# Patient Record
Sex: Female | Born: 1985 | Race: Black or African American | Hispanic: No | Marital: Married | Smoking: Never smoker
Health system: Southern US, Community
[De-identification: ages and names within clinical notes are randomized; demographics above are authoritative.]

## PROBLEM LIST (undated history)

## (undated) ENCOUNTER — Ambulatory Visit: Payer: Self-pay | Source: Home / Self Care

## (undated) DIAGNOSIS — I10 Essential (primary) hypertension: Secondary | ICD-10-CM

## (undated) DIAGNOSIS — Z8619 Personal history of other infectious and parasitic diseases: Secondary | ICD-10-CM

## (undated) DIAGNOSIS — R569 Unspecified convulsions: Secondary | ICD-10-CM

## (undated) DIAGNOSIS — J302 Other seasonal allergic rhinitis: Secondary | ICD-10-CM

## (undated) HISTORY — PX: OTHER SURGICAL HISTORY: SHX169

## (undated) HISTORY — DX: Personal history of other infectious and parasitic diseases: Z86.19

## (undated) HISTORY — PX: KNEE ARTHROSCOPY WITH ANTERIOR CRUCIATE LIGAMENT (ACL) REPAIR: SHX5644

---

## 2007-07-18 ENCOUNTER — Emergency Department (HOSPITAL_COMMUNITY): Admission: EM | Admit: 2007-07-18 | Discharge: 2007-07-19 | Payer: Self-pay | Admitting: Emergency Medicine

## 2009-06-13 ENCOUNTER — Emergency Department (HOSPITAL_COMMUNITY): Admission: EM | Admit: 2009-06-13 | Discharge: 2009-06-13 | Payer: Self-pay | Admitting: Family Medicine

## 2011-03-03 LAB — URINE CULTURE
Colony Count: NO GROWTH
Culture: NO GROWTH

## 2011-03-03 LAB — DIFFERENTIAL
Basophils Absolute: 0
Basophils Relative: 0
Lymphocytes Relative: 12
Monocytes Absolute: 0.6
Neutro Abs: 8.7 — ABNORMAL HIGH
Neutrophils Relative %: 82 — ABNORMAL HIGH

## 2011-03-03 LAB — I-STAT 8, (EC8 V) (CONVERTED LAB)
Acid-base deficit: 1
BUN: 14
Bicarbonate: 24.4 — ABNORMAL HIGH
HCT: 42
Hemoglobin: 14.3
Operator id: 161631
Sodium: 141
TCO2: 26
pCO2, Ven: 43.3 — ABNORMAL LOW

## 2011-03-03 LAB — URINE MICROSCOPIC-ADD ON

## 2011-03-03 LAB — URINALYSIS, ROUTINE W REFLEX MICROSCOPIC
Ketones, ur: >80 — AB
Nitrite: NEGATIVE
Specific Gravity, Urine: 1.037 — ABNORMAL HIGH
Urobilinogen, UA: 0.2
pH: 5.5

## 2011-03-03 LAB — POCT CARDIAC MARKERS: Myoglobin, poc: 50.8

## 2011-03-03 LAB — CBC
Hemoglobin: 12.9
MCHC: 33.5
RDW: 13.2

## 2011-03-03 LAB — POCT I-STAT CREATININE
Creatinine, Ser: 0.9
Operator id: 161631

## 2011-03-03 LAB — POCT PREGNANCY, URINE
Operator id: 161631
Preg Test, Ur: NEGATIVE

## 2014-04-16 ENCOUNTER — Emergency Department (HOSPITAL_COMMUNITY): Payer: No Typology Code available for payment source

## 2014-04-16 ENCOUNTER — Emergency Department (HOSPITAL_COMMUNITY)
Admission: EM | Admit: 2014-04-16 | Discharge: 2014-04-16 | Disposition: A | Discharge status: Home / Self Care | Payer: No Typology Code available for payment source | Attending: Emergency Medicine | Admitting: Emergency Medicine

## 2014-04-16 ENCOUNTER — Encounter (HOSPITAL_COMMUNITY): Payer: Self-pay | Admitting: Emergency Medicine

## 2014-04-16 DIAGNOSIS — S0990XA Unspecified injury of head, initial encounter: Secondary | ICD-10-CM | POA: Insufficient documentation

## 2014-04-16 DIAGNOSIS — S134XXA Sprain of ligaments of cervical spine, initial encounter: Secondary | ICD-10-CM | POA: Diagnosis not present

## 2014-04-16 DIAGNOSIS — Y92410 Unspecified street and highway as the place of occurrence of the external cause: Secondary | ICD-10-CM | POA: Insufficient documentation

## 2014-04-16 DIAGNOSIS — S4992XA Unspecified injury of left shoulder and upper arm, initial encounter: Secondary | ICD-10-CM | POA: Diagnosis not present

## 2014-04-16 DIAGNOSIS — S139XXA Sprain of joints and ligaments of unspecified parts of neck, initial encounter: Secondary | ICD-10-CM

## 2014-04-16 DIAGNOSIS — Y9389 Activity, other specified: Secondary | ICD-10-CM | POA: Insufficient documentation

## 2014-04-16 DIAGNOSIS — S199XXA Unspecified injury of neck, initial encounter: Secondary | ICD-10-CM | POA: Diagnosis present

## 2014-04-16 MED ORDER — CYCLOBENZAPRINE HCL 10 MG PO TABS: 10.0000 mg | ORAL_TABLET | Freq: Two times a day (BID) | ORAL | Status: DC | PRN

## 2014-04-16 MED ORDER — ACETAMINOPHEN 325 MG PO TABS
650.0000 mg | ORAL_TABLET | Freq: Once | ORAL | Status: AC
  Administered 2014-04-16: 650 mg via ORAL
  Filled 2014-04-16: qty 2

## 2014-04-16 MED ORDER — IBUPROFEN 800 MG PO TABS: 800.0000 mg | ORAL_TABLET | Freq: Three times a day (TID) | ORAL | Status: DC

## 2014-04-16 NOTE — Discharge Instructions (Signed)
Cryotherapy °Cryotherapy means treatment with cold. Ice or gel packs can be used to reduce both pain and swelling. Ice is the most helpful within the first 24 to 48 hours after an injury or flare-up from overusing a muscle or joint. Sprains, strains, spasms, burning pain, shooting pain, and aches can all be eased with ice. Ice can also be used when recovering from surgery. Ice is effective, has very few side effects, and is safe for most people to use. °PRECAUTIONS  °Ice is not a safe treatment option for people with: °· Raynaud phenomenon. This is a condition affecting small blood vessels in the extremities. Exposure to cold may cause your problems to return. °· Cold hypersensitivity. There are many forms of cold hypersensitivity, including: °¨ Cold urticaria. Red, itchy hives appear on the skin when the tissues begin to warm after being iced. °¨ Cold erythema. This is a red, itchy rash caused by exposure to cold. °¨ Cold hemoglobinuria. Red blood cells break down when the tissues begin to warm after being iced. The hemoglobin that carry oxygen are passed into the urine because they cannot combine with blood proteins fast enough. °· Numbness or altered sensitivity in the area being iced. °If you have any of the following conditions, do not use ice until you have discussed cryotherapy with your caregiver: °· Heart conditions, such as arrhythmia, angina, or chronic heart disease. °· High blood pressure. °· Healing wounds or open skin in the area being iced. °· Current infections. °· Rheumatoid arthritis. °· Poor circulation. °· Diabetes. °Ice slows the blood flow in the region it is applied. This is beneficial when trying to stop inflamed tissues from spreading irritating chemicals to surrounding tissues. However, if you expose your skin to cold temperatures for too long or without the proper protection, you can damage your skin or nerves. Watch for signs of skin damage due to cold. °HOME CARE INSTRUCTIONS °Follow  these tips to use ice and cold packs safely. °· Place a dry or damp towel between the ice and skin. A damp towel will cool the skin more quickly, so you may need to shorten the time that the ice is used. °· For a more rapid response, add gentle compression to the ice. °· Ice for no more than 10 to 20 minutes at a time. The bonier the area you are icing, the less time it will take to get the benefits of ice. °· Check your skin after 5 minutes to make sure there are no signs of a poor response to cold or skin damage. °· Rest 20 minutes or more between uses. °· Once your skin is numb, you can end your treatment. You can test numbness by very lightly touching your skin. The touch should be so light that you do not see the skin dimple from the pressure of your fingertip. When using ice, most people will feel these normal sensations in this order: cold, burning, aching, and numbness. °· Do not use ice on someone who cannot communicate their responses to pain, such as small children or people with dementia. °HOW TO MAKE AN ICE PACK °Ice packs are the most common way to use ice therapy. Other methods include ice massage, ice baths, and cryosprays. Muscle creams that cause a cold, tingly feeling do not offer the same benefits that ice offers and should not be used as a substitute unless recommended by your caregiver. °To make an ice pack, do one of the following: °· Place crushed ice or a   bag of frozen vegetables in a sealable plastic bag. Squeeze out the excess air. Place this bag inside another plastic bag. Slide the bag into a pillowcase or place a damp towel between your skin and the bag.  Mix 3 parts water with 1 part rubbing alcohol. Freeze the mixture in a sealable plastic bag. When you remove the mixture from the freezer, it will be slushy. Squeeze out the excess air. Place this bag inside another plastic bag. Slide the bag into a pillowcase or place a damp towel between your skin and the bag. SEEK MEDICAL CARE  IF:  You develop white spots on your skin. This may give the skin a blotchy (mottled) appearance.  Your skin turns blue or pale.  Your skin becomes waxy or hard.  Your swelling gets worse. MAKE SURE YOU:   Understand these instructions.  Will watch your condition.  Will get help right away if you are not doing well or get worse. Document Released: 01/23/2011 Document Revised: 10/13/2013 Document Reviewed: 01/23/2011 [facility] Patient Information 2015 [place], [place]. This information is not intended to replace advice given to you by your health care provider. Make sure you discuss any questions you have with your health care provider.  Cervical Sprain A cervical sprain is an injury in the neck in which the strong, fibrous tissues (ligaments) that connect your neck bones stretch or tear. Cervical sprains can range from mild to severe. Severe cervical sprains can cause the neck vertebrae to be unstable. This can lead to damage of the spinal cord and can result in serious nervous system problems. The amount of time it takes for a cervical sprain to get better depends on the cause and extent of the injury. Most cervical sprains heal in 1 to 3 weeks. CAUSES  Severe cervical sprains may be caused by:   Contact sport injuries (such as from football, rugby, wrestling, hockey, auto racing, gymnastics, diving, martial arts, or boxing).   Motor vehicle collisions.   Whiplash injuries. This is an injury from a sudden forward and backward whipping movement of the head and neck.  Falls.  Mild cervical sprains may be caused by:   Being in an awkward position, such as while cradling a telephone between your ear and shoulder.   Sitting in a chair that does not offer proper support.   Working at a poorly Marketing executivedesigned computer station.   Looking up or down for long periods of time.  SYMPTOMS   Pain, soreness, stiffness, or a burning sensation in the front, back, or sides of the neck. This  discomfort may develop immediately after the injury or slowly, 24 hours or more after the injury.   Pain or tenderness directly in the middle of the back of the neck.   Shoulder or upper back pain.   Limited ability to move the neck.   Headache.   Dizziness.   Weakness, numbness, or tingling in the hands or arms.   Muscle spasms.   Difficulty swallowing or chewing.   Tenderness and swelling of the neck.  DIAGNOSIS  Most of the time your health care provider can diagnose a cervical sprain by taking your history and doing a physical exam. Your health care provider will ask about previous neck injuries and any known neck problems, such as arthritis in the neck. X-rays may be taken to find out if there are any other problems, such as with the bones of the neck. Other tests, such as a CT scan or MRI, may also  be needed.  TREATMENT  Treatment depends on the severity of the cervical sprain. Mild sprains can be treated with rest, keeping the neck in place (immobilization), and pain medicines. Severe cervical sprains are immediately immobilized. Further treatment is done to help with pain, muscle spasms, and other symptoms and may include:  Medicines, such as pain relievers, numbing medicines, or muscle relaxants.   Physical therapy. This may involve stretching exercises, strengthening exercises, and posture training. Exercises and improved posture can help stabilize the neck, strengthen muscles, and help stop symptoms from returning.  HOME CARE INSTRUCTIONS   Put ice on the injured area.   Put ice in a plastic bag.   Place a towel between your skin and the bag.   Leave the ice on for 15-20 minutes, 3-4 times a day.   If your injury was severe, you may have been given a cervical collar to wear. A cervical collar is a two-piece collar designed to keep your neck from moving while it heals.  Do not remove the collar unless instructed by your health care provider.  If you  have long hair, keep it outside of the collar.  Ask your health care provider before making any adjustments to your collar. Minor adjustments may be required over time to improve comfort and reduce pressure on your chin or on the back of your head.  Ifyou are allowed to remove the collar for cleaning or bathing, follow your health care provider's instructions on how to do so safely.  Keep your collar clean by wiping it with mild soap and water and drying it completely. If the collar you have been given includes removable pads, remove them every 1-2 days and hand wash them with soap and water. Allow them to air dry. They should be completely dry before you wear them in the collar.  If you are allowed to remove the collar for cleaning and bathing, wash and dry the skin of your neck. Check your skin for irritation or sores. If you see any, tell your health care provider.  Do not drive while wearing the collar.   Only take over-the-counter or prescription medicines for pain, discomfort, or fever as directed by your health care provider.   Keep all follow-up appointments as directed by your health care provider.   Keep all physical therapy appointments as directed by your health care provider.   Make any needed adjustments to your workstation to promote good posture.   Avoid positions and activities that make your symptoms worse.   Warm up and stretch before being active to help prevent problems.  SEEK MEDICAL CARE IF:   Your pain is not controlled with medicine.   You are unable to decrease your pain medicine over time as planned.   Your activity level is not improving as expected.  SEEK IMMEDIATE MEDICAL CARE IF:   You develop any bleeding.  You develop stomach upset.  You have signs of an allergic reaction to your medicine.   Your symptoms get worse.   You develop new, unexplained symptoms.   You have numbness, tingling, weakness, or paralysis in any part of your  body.  MAKE SURE YOU:   Understand these instructions.  Will watch your condition.  Will get help right away if you are not doing well or get worse. Document Released: 03/26/2007 Document Revised: 06/03/2013 Document Reviewed: 12/04/2012 [facility]Exit[facility] Patient Information 2015 [place]ExitCare, [place]LLC. This information is not intended to replace advice given to you by your health care provider. Make  sure you discuss any questions you have with your health care provider. ° °Motor Vehicle Collision °It is common to have multiple bruises and sore muscles after a motor vehicle collision (MVC). These tend to feel worse for the first 24 hours. You may have the most stiffness and soreness over the first several hours. You may also feel worse when you wake up the first morning after your collision. After this point, you will usually begin to improve with each day. The speed of improvement often depends on the severity of the collision, the number of injuries, and the location and nature of these injuries. °HOME CARE INSTRUCTIONS °· Put ice on the injured area. °¨ Put ice in a plastic bag. °¨ Place a towel between your skin and the bag. °¨ Leave the ice on for 15-20 minutes, 3-4 times a day, or as directed by your health care provider. °· Drink enough fluids to keep your urine clear or pale yellow. Do not drink alcohol. °· Take a warm shower or bath once or twice a day. This will increase blood flow to sore muscles. °· You may return to activities as directed by your caregiver. Be careful when lifting, as this may aggravate neck or back pain. °· Only take over-the-counter or prescription medicines for pain, discomfort, or fever as directed by your caregiver. Do not use aspirin. This may increase bruising and bleeding. °SEEK IMMEDIATE MEDICAL CARE IF: °· You have numbness, tingling, or weakness in the arms or legs. °· You develop severe headaches not relieved with medicine. °· You have severe neck pain, especially tenderness in  the middle of the back of your neck. °· You have changes in bowel or bladder control. °· There is increasing pain in any area of the body. °· You have shortness of breath, light-headedness, dizziness, or fainting. °· You have chest pain. °· You feel sick to your stomach (nauseous), throw up (vomit), or sweat. °· You have increasing abdominal discomfort. °· There is blood in your urine, stool, or vomit. °· You have pain in your shoulder (shoulder strap areas). °· You feel your symptoms are getting worse. °MAKE SURE YOU: °· Understand these instructions. °· Will watch your condition. °· Will get help right away if you are not doing well or get worse. °Document Released: 05/29/2005 Document Revised: 10/13/2013 Document Reviewed: 10/26/2010 °ExitCare® Patient Information ©2015 [employer], LLC. This information is not intended to replace advice given to you by your health care provider. Make sure you discuss any questions you have with your health care provider. ° °
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
## 2014-06-12 NOTE — L&D Delivery Note (Signed)
Delivery Note Pt progressed to complete and pushed well.  At 8:54 PM a viable female was delivered via Vaginal, Spontaneous Delivery (Presentation: Left Occiput Anterior).  APGAR: 8, 9; weight pending.   Placenta status: Intact, Spontaneous.  Cord: 3 vessels with the following complications: None.  Meconium was thought to be light, but was fairly thick at delivery.  Baby bulb suctioned and stimulated and responded well.  Anesthesia: Epidural  Episiotomy: None Lacerations: None Suture Repair: none Est. Blood Loss (mL): 100  Mom to postpartum.  Baby to Couplet care / Skin to Skin.  Lenka Zhao D 01/23/2015, 9:11 PM

## 2014-09-17 LAB — OB RESULTS CONSOLE HEPATITIS B SURFACE ANTIGEN: Hepatitis B Surface Ag: NEGATIVE

## 2014-09-17 LAB — OB RESULTS CONSOLE RPR: RPR: NONREACTIVE

## 2014-09-17 LAB — OB RESULTS CONSOLE ANTIBODY SCREEN: ANTIBODY SCREEN: NEGATIVE

## 2014-09-17 LAB — OB RESULTS CONSOLE GC/CHLAMYDIA
Chlamydia: POSITIVE
GC PROBE AMP, GENITAL: NEGATIVE

## 2014-09-17 LAB — OB RESULTS CONSOLE ABO/RH: RH Type: POSITIVE

## 2014-09-17 LAB — OB RESULTS CONSOLE RUBELLA ANTIBODY, IGM: Rubella: IMMUNE

## 2014-09-17 LAB — OB RESULTS CONSOLE HIV ANTIBODY (ROUTINE TESTING): HIV: NONREACTIVE

## 2014-11-05 LAB — OB RESULTS CONSOLE GC/CHLAMYDIA
Chlamydia: NEGATIVE
Gonorrhea: NEGATIVE

## 2014-12-23 LAB — OB RESULTS CONSOLE GBS: GBS: NEGATIVE

## 2015-01-20 ENCOUNTER — Encounter (HOSPITAL_COMMUNITY): Payer: Self-pay | Admitting: *Deleted

## 2015-01-20 ENCOUNTER — Telehealth (HOSPITAL_COMMUNITY): Payer: Self-pay | Admitting: *Deleted

## 2015-01-20 NOTE — Telephone Encounter (Signed)
Preadmission screen  

## 2015-01-23 ENCOUNTER — Inpatient Hospital Stay (HOSPITAL_COMMUNITY): Payer: Medicaid Other | Admitting: Anesthesiology

## 2015-01-23 ENCOUNTER — Encounter (HOSPITAL_COMMUNITY): Payer: Self-pay | Admitting: *Deleted

## 2015-01-23 ENCOUNTER — Inpatient Hospital Stay (HOSPITAL_COMMUNITY)
Admission: AD | Admit: 2015-01-23 | Discharge: 2015-01-25 | DRG: 775 | Disposition: A | Discharge status: Home / Self Care | Payer: Medicaid Other | Source: Ambulatory Visit | Attending: Obstetrics and Gynecology | Admitting: Obstetrics and Gynecology

## 2015-01-23 DIAGNOSIS — O480 Post-term pregnancy: Secondary | ICD-10-CM | POA: Diagnosis present

## 2015-01-23 DIAGNOSIS — Z3A40 40 weeks gestation of pregnancy: Secondary | ICD-10-CM | POA: Diagnosis present

## 2015-01-23 DIAGNOSIS — Z3480 Encounter for supervision of other normal pregnancy, unspecified trimester: Secondary | ICD-10-CM

## 2015-01-23 DIAGNOSIS — O133 Gestational [pregnancy-induced] hypertension without significant proteinuria, third trimester: Secondary | ICD-10-CM

## 2015-01-23 HISTORY — DX: Essential (primary) hypertension: I10

## 2015-01-23 LAB — COMPREHENSIVE METABOLIC PANEL
ALT: 17 U/L (ref 14–54)
AST: 25 U/L (ref 15–41)
Albumin: 3.1 g/dL — ABNORMAL LOW (ref 3.5–5.0)
Alkaline Phosphatase: 111 U/L (ref 38–126)
Anion gap: 10 (ref 5–15)
BILIRUBIN TOTAL: 1.7 mg/dL — AB (ref 0.3–1.2)
CALCIUM: 8.8 mg/dL — AB (ref 8.9–10.3)
CO2: 20 mmol/L — AB (ref 22–32)
CREATININE: 0.42 mg/dL — AB (ref 0.44–1.00)
Chloride: 110 mmol/L (ref 101–111)
GFR calc Af Amer: >60 mL/min (ref >60)
GLUCOSE: 73 mg/dL (ref 65–99)
Potassium: 3.1 mmol/L — ABNORMAL LOW (ref 3.5–5.1)
Sodium: 140 mmol/L (ref 135–145)
Total Protein: 6.5 g/dL (ref 6.5–8.1)

## 2015-01-23 LAB — CBC
HEMATOCRIT: 36.6 % (ref 36.0–46.0)
HEMOGLOBIN: 12.3 g/dL (ref 12.0–15.0)
MCH: 29.9 pg (ref 26.0–34.0)
MCHC: 33.6 g/dL (ref 30.0–36.0)
MCV: 89.1 fL (ref 78.0–100.0)
Platelets: 164 10*3/uL (ref 150–400)
RBC: 4.11 MIL/uL (ref 3.87–5.11)
RDW: 14.8 % (ref 11.5–15.5)
WBC: 8.5 10*3/uL (ref 4.0–10.5)

## 2015-01-23 LAB — PROTEIN / CREATININE RATIO, URINE
CREATININE, URINE: 89 mg/dL
PROTEIN CREATININE RATIO: 0.24 mg/mg{creat} — AB (ref 0.00–0.15)
TOTAL PROTEIN, URINE: 21 mg/dL

## 2015-01-23 LAB — TYPE AND SCREEN
ABO/RH(D): O POS
Antibody Screen: NEGATIVE

## 2015-01-23 LAB — ABO/RH: ABO/RH(D): O POS

## 2015-01-23 MED ORDER — CITRIC ACID-SODIUM CITRATE 334-500 MG/5ML PO SOLN
30.0000 mL | ORAL | Status: DC | PRN
Start: 2015-01-23 — End: 2015-01-23

## 2015-01-23 MED ORDER — HYDRALAZINE HCL 20 MG/ML IJ SOLN: 10.0000 mg | INTRAMUSCULAR | Status: DC | PRN

## 2015-01-23 MED ORDER — WITCH HAZEL-GLYCERIN EX PADS: 1.0000 "application " | MEDICATED_PAD | CUTANEOUS | Status: DC | PRN

## 2015-01-23 MED ORDER — BENZOCAINE-MENTHOL 20-0.5 % EX AERO: 1.0000 "application " | INHALATION_SPRAY | CUTANEOUS | Status: DC | PRN

## 2015-01-23 MED ORDER — SENNOSIDES-DOCUSATE SODIUM 8.6-50 MG PO TABS
2.0000 | ORAL_TABLET | ORAL | Status: DC
  Administered 2015-01-23 – 2015-01-24 (×2): 2 via ORAL
  Filled 2015-01-23 (×2): qty 2

## 2015-01-23 MED ORDER — TERBUTALINE SULFATE 1 MG/ML IJ SOLN
0.2500 mg | Freq: Once | INTRAMUSCULAR | Status: DC | PRN
  Filled 2015-01-23: qty 1

## 2015-01-23 MED ORDER — LACTATED RINGERS IV SOLN: 500.0000 mL | INTRAVENOUS | Status: DC | PRN

## 2015-01-23 MED ORDER — BUTORPHANOL TARTRATE 1 MG/ML IJ SOLN
1.0000 mg | INTRAMUSCULAR | Status: DC | PRN
Start: 2015-01-23 — End: 2015-01-23
  Administered 2015-01-23: 1 mg via INTRAVENOUS
  Filled 2015-01-23: qty 1

## 2015-01-23 MED ORDER — LANOLIN HYDROUS EX OINT: TOPICAL_OINTMENT | CUTANEOUS | Status: DC | PRN

## 2015-01-23 MED ORDER — LACTATED RINGERS IV SOLN
INTRAVENOUS | Status: DC
  Administered 2015-01-23 (×2): via INTRAVENOUS

## 2015-01-23 MED ORDER — OXYTOCIN BOLUS FROM INFUSION
500.0000 mL | INTRAVENOUS | Status: DC
  Administered 2015-01-23: 500 mL via INTRAVENOUS

## 2015-01-23 MED ORDER — OXYCODONE-ACETAMINOPHEN 5-325 MG PO TABS: 2.0000 | ORAL_TABLET | ORAL | Status: DC | PRN

## 2015-01-23 MED ORDER — MAGNESIUM HYDROXIDE 400 MG/5ML PO SUSP: 30.0000 mL | ORAL | Status: DC | PRN

## 2015-01-23 MED ORDER — METHYLERGONOVINE MALEATE 0.2 MG PO TABS: 0.2000 mg | ORAL_TABLET | ORAL | Status: DC | PRN

## 2015-01-23 MED ORDER — ONDANSETRON HCL 4 MG/2ML IJ SOLN: 4.0000 mg | INTRAMUSCULAR | Status: DC | PRN

## 2015-01-23 MED ORDER — DIPHENHYDRAMINE HCL 25 MG PO CAPS: 25.0000 mg | ORAL_CAPSULE | Freq: Four times a day (QID) | ORAL | Status: DC | PRN

## 2015-01-23 MED ORDER — ONDANSETRON HCL 4 MG PO TABS: 4.0000 mg | ORAL_TABLET | ORAL | Status: DC | PRN

## 2015-01-23 MED ORDER — IBUPROFEN 600 MG PO TABS
600.0000 mg | ORAL_TABLET | Freq: Four times a day (QID) | ORAL | Status: DC
  Administered 2015-01-23 – 2015-01-25 (×7): 600 mg via ORAL
  Filled 2015-01-23 (×7): qty 1

## 2015-01-23 MED ORDER — OXYCODONE-ACETAMINOPHEN 5-325 MG PO TABS: 1.0000 | ORAL_TABLET | ORAL | Status: DC | PRN

## 2015-01-23 MED ORDER — OXYTOCIN 40 UNITS IN LACTATED RINGERS INFUSION - SIMPLE MED
1.0000 m[IU]/min | INTRAVENOUS | Status: DC
  Administered 2015-01-23: 2 m[IU]/min via INTRAVENOUS
  Filled 2015-01-23: qty 1000

## 2015-01-23 MED ORDER — MEASLES, MUMPS & RUBELLA VAC ~~LOC~~ INJ: 0.5000 mL | INJECTION | Freq: Once | SUBCUTANEOUS | Status: DC

## 2015-01-23 MED ORDER — OXYTOCIN 40 UNITS IN LACTATED RINGERS INFUSION - SIMPLE MED
62.5000 mL/h | INTRAVENOUS | Status: DC
  Administered 2015-01-23: 62.5 mL/h via INTRAVENOUS

## 2015-01-23 MED ORDER — LIDOCAINE HCL (PF) 1 % IJ SOLN
30.0000 mL | INTRAMUSCULAR | Status: DC | PRN
  Filled 2015-01-23: qty 30

## 2015-01-23 MED ORDER — SIMETHICONE 80 MG PO CHEW: 80.0000 mg | CHEWABLE_TABLET | ORAL | Status: DC | PRN

## 2015-01-23 MED ORDER — METHYLERGONOVINE MALEATE 0.2 MG/ML IJ SOLN: 0.2000 mg | INTRAMUSCULAR | Status: DC | PRN

## 2015-01-23 MED ORDER — LABETALOL HCL 5 MG/ML IV SOLN
80.0000 mg | INTRAVENOUS | Status: DC | PRN
  Administered 2015-01-23: 80 mg via INTRAVENOUS
  Filled 2015-01-23: qty 16

## 2015-01-23 MED ORDER — HYDRALAZINE HCL 20 MG/ML IJ SOLN
10.0000 mg | Freq: Once | INTRAMUSCULAR | Status: AC | PRN
  Administered 2015-01-23: 10 mg via INTRAVENOUS
  Filled 2015-01-23: qty 1

## 2015-01-23 MED ORDER — EPHEDRINE 5 MG/ML INJ
10.0000 mg | INTRAVENOUS | Status: DC | PRN
  Filled 2015-01-23: qty 2

## 2015-01-23 MED ORDER — DIBUCAINE 1 % RE OINT: 1.0000 "application " | TOPICAL_OINTMENT | RECTAL | Status: DC | PRN

## 2015-01-23 MED ORDER — TETANUS-DIPHTH-ACELL PERTUSSIS 5-2.5-18.5 LF-MCG/0.5 IM SUSP: 0.5000 mL | Freq: Once | INTRAMUSCULAR | Status: DC

## 2015-01-23 MED ORDER — LIDOCAINE HCL (PF) 1 % IJ SOLN
INTRAMUSCULAR | Status: DC | PRN
  Administered 2015-01-23 (×2): 8 mL via EPIDURAL

## 2015-01-23 MED ORDER — PRENATAL MULTIVITAMIN CH
1.0000 | ORAL_TABLET | Freq: Every day | ORAL | Status: DC
  Administered 2015-01-24 – 2015-01-25 (×2): 1 via ORAL
  Filled 2015-01-23 (×2): qty 1

## 2015-01-23 MED ORDER — ZOLPIDEM TARTRATE 5 MG PO TABS: 5.0000 mg | ORAL_TABLET | Freq: Every evening | ORAL | Status: DC | PRN

## 2015-01-23 MED ORDER — FENTANYL 2.5 MCG/ML BUPIVACAINE 1/10 % EPIDURAL INFUSION (WH - ANES)
14.0000 mL/h | INTRAMUSCULAR | Status: DC | PRN
  Administered 2015-01-23: 14 mL/h via EPIDURAL
  Filled 2015-01-23: qty 125

## 2015-01-23 MED ORDER — ACETAMINOPHEN 325 MG PO TABS
650.0000 mg | ORAL_TABLET | ORAL | Status: DC | PRN
  Administered 2015-01-23: 650 mg via ORAL
  Filled 2015-01-23: qty 2

## 2015-01-23 MED ORDER — DIPHENHYDRAMINE HCL 50 MG/ML IJ SOLN: 12.5000 mg | INTRAMUSCULAR | Status: DC | PRN

## 2015-01-23 MED ORDER — ONDANSETRON HCL 4 MG/2ML IJ SOLN: 4.0000 mg | Freq: Four times a day (QID) | INTRAMUSCULAR | Status: DC | PRN

## 2015-01-23 MED ORDER — PHENYLEPHRINE 40 MCG/ML (10ML) SYRINGE FOR IV PUSH (FOR BLOOD PRESSURE SUPPORT)
80.0000 ug | PREFILLED_SYRINGE | INTRAVENOUS | Status: DC | PRN
  Filled 2015-01-23: qty 2
  Filled 2015-01-23: qty 20

## 2015-01-23 MED ORDER — LABETALOL HCL 5 MG/ML IV SOLN
20.0000 mg | INTRAVENOUS | Status: AC | PRN
  Administered 2015-01-23: 20 mg via INTRAVENOUS
  Administered 2015-01-23: 40 mg via INTRAVENOUS
  Administered 2015-01-23: 80 mg via INTRAVENOUS
  Filled 2015-01-23: qty 4
  Filled 2015-01-23: qty 16
  Filled 2015-01-23: qty 8

## 2015-01-23 NOTE — H&P (Addendum)
Sara Bell is a 29 y.o. female, G2 P1001, EGA [redacted] weeks with EDC 8-12 presenting for eval of ctx.  While in MAU had ROM with light meconium, irreg ctx, VE 1 cm.  BP also slightly elevated in MAU, labs normal.  Pt admitted and started on pitocin augmentation.  She progressed and received an epidural.  She required IV Labetalol and hydralazine to control her BP, no other signs/sx of preeclampsia.  Maternal Medical History:  Reason for admission: Rupture of membranes and contractions.   Contractions: Frequency: regular.   Perceived severity is moderate.    Fetal activity: Perceived fetal activity is normal.    Prenatal complications: no prenatal complications   OB History    Gravida Para Term Preterm AB TAB SAB Ectopic Multiple Living   Past Medical History  Diagnosis Date  . Hx of varicella   . Hx of chlamydia infection   . Hypertension    Past Surgical History  Procedure Laterality Date  . Knee arthroscopy with anterior cruciate ligament (acl) repair      r/knee  . Other surgical history Left     Implanon removed   Family History: family history includes Hypertension in her father and mother. Social History:  reports that she has never smoked. She has never used smokeless tobacco. She reports that she does not drink alcohol or use illicit drugs.   Prenatal Transfer Tool  Maternal Diabetes: No Genetic Screening: Declined Maternal Ultrasounds/Referrals: Normal Fetal Ultrasounds or other Referrals:  None Maternal Substance Abuse:  No Significant Maternal Medications:  None Significant Maternal Lab Results:  Lab values include: Group B Strep negative Other Comments:  None  Review of Systems  Respiratory: Negative.   Cardiovascular: Negative.     Dilation: 10 Effacement (%): 100 Station: 0 Exam by:: Conor Lata md Blood pressure 125/85, pulse 90, temperature 99.5 F (37.5 C), temperature source Oral, resp. rate 20, height  (1.676 m), weight  85.078 kg (187 lb 9 oz), last menstrual period 03/30/2014, SpO2 99 %. Maternal Exam:  Uterine Assessment: Contraction strength is mild.  Contraction frequency is irregular.   Abdomen: Patient reports no abdominal tenderness. Estimated fetal weight is 7 1/2 lbs.   Fetal presentation: vertex  Introitus: Normal vulva. Amniotic fluid character: meconium stained.  Pelvis: adequate for delivery.   Cervix: Cervix evaluated by digital exam.     Physical Exam  Vitals reviewed. Constitutional: She appears well-developed and well-nourished.  Cardiovascular: Normal rate, regular rhythm and normal heart sounds.   No murmur heard. Respiratory: Effort normal and breath sounds normal. No respiratory distress. She has no wheezes.  GI: Soft.    Prenatal labs: ABO, Rh: --/--/O POS (08/13 1400) Antibody: NEG (08/13 1400) Rubella: Immune (04/07 0000) RPR: Nonreactive (04/07 0000)  HBsAg: Negative (04/07 0000)  HIV: Non-reactive (04/07 0000)  GBS: Negative (07/13 0000)   Assessment/Plan: IUP at 40 weeks admitted in early labor with ROM with light meconium, labor augmented with pitocin, see delivery note.  Also with PIH, BP requiring IV meds to control, no other signs/sx of preeclampsia, will monitor and treat prn.   Kobyn Kray D 01/23/2015, 9:07 PM

## 2015-01-23 NOTE — Progress Notes (Signed)
Dr Jackelyn Knife notified of pt's arrival, ROM with yellow meconium stained fluid, blood pressure and VE, orders received to admit pt.

## 2015-01-23 NOTE — Anesthesia Procedure Notes (Signed)
Epidural Patient location during procedure: OB Start time: 01/23/2015 6:49 PM End time: 01/23/2015 6:53 PM  Staffing Anesthesiologist: Leilani Able Performed by: anesthesiologist   Preanesthetic Checklist Completed: patient identified, surgical consent, pre-op evaluation, timeout performed, IV checked, risks and benefits discussed and monitors and equipment checked  Epidural Patient position: sitting Prep: site prepped and draped and DuraPrep Patient monitoring: continuous pulse ox and blood pressure Approach: midline Location: L3-L4 Injection technique: LOR air  Needle:  Needle type: Tuohy  Needle gauge: 17 G Needle length: 9 cm and 9 Needle insertion depth: 6 cm Catheter type: closed end flexible Catheter size: 19 Gauge Catheter at skin depth: 11 cm Test dose: negative and Other  Assessment Sensory level: T9 Events: blood not aspirated, injection not painful, no injection resistance, negative IV test and no paresthesia  Additional Notes Reason for block:procedure for pain

## 2015-01-23 NOTE — Progress Notes (Signed)
Dr Jackelyn Knife paged

## 2015-01-23 NOTE — Anesthesia Preprocedure Evaluation (Signed)
Anesthesia Evaluation  Patient identified by MRN, date of birth, ID band Patient awake    Reviewed: Allergy & Precautions, H&P , NPO status , Patient's Chart, lab work & pertinent test results  Airway Mallampati: II  TM Distance: >3 FB Neck ROM: full    Dental no notable dental hx.    Pulmonary neg pulmonary ROS,    Pulmonary exam normal       Cardiovascular hypertension, Pt. on medications Normal cardiovascular exam    Neuro/Psych negative neurological ROS  negative psych ROS   GI/Hepatic negative GI ROS, Neg liver ROS,   Endo/Other  negative endocrine ROS  Renal/GU negative Renal ROS     Musculoskeletal   Abdominal Normal abdominal exam  (+)   Peds  Hematology negative hematology ROS (+)   Anesthesia Other Findings   Reproductive/Obstetrics (+) Pregnancy                             Anesthesia Physical Anesthesia Plan  ASA: II  Anesthesia Plan: Epidural   Post-op Pain Management:    Induction:   Airway Management Planned:   Additional Equipment:   Intra-op Plan:   Post-operative Plan:   Informed Consent: I have reviewed the patients History and Physical, chart, labs and discussed the procedure including the risks, benefits and alternatives for the proposed anesthesia with the patient or authorized representative who has indicated his/her understanding and acceptance.     Plan Discussed with:   Anesthesia Plan Comments:         Anesthesia Quick Evaluation

## 2015-01-23 NOTE — MAU Note (Signed)
Pt states here for contractions q5-7 minutes apart. Has had small amount of leaking noted only after voiding. No bleeding. Cervix closed Wednesday.

## 2015-01-24 LAB — RPR: RPR: NONREACTIVE

## 2015-01-24 NOTE — Lactation Note (Signed)
This note was copied from the chart of Sara Bell. Lactation Consultation Note  Initial visit made.  Breastfeeding consultation and support information given and reviewed.  Mom states she breastfed her first baby for 13 months.  She states newborn has been feeding well and just finished a 40 minute feeding.  Instructed to feed with any feeding cue and to call with concerns/assist prn.  Patient Name: Sara Jenesa Foresta [id number] Date: 01/24/2015 Reason for consult: Initial assessment   Maternal Data Formula Feeding for Exclusion: No Has patient been taught Hand Expression?: Yes Does the patient have breastfeeding experience prior to this delivery?: Yes  Feeding Feeding Type: Breast Fed Length of feed: 30 min  LATCH Score/Interventions Latch: Grasps breast easily, tongue down, lips flanged, rhythmical sucking. Intervention(s): Skin to skin  Audible Swallowing: Spontaneous and intermittent Intervention(s): Skin to skin  Type of Nipple: Everted at rest and after stimulation  Comfort (Breast/Nipple): Soft / non-tender     Hold (Positioning): No assistance needed to correctly position infant at breast. Intervention(s): Skin to skin  LATCH Score: 10  Lactation Tools Discussed/Used     Consult Status Consult Status: Follow-up Date: 01/25/15 Follow-up type: In-patient    Huston Foley 01/24/2015, 9:26 AM

## 2015-01-24 NOTE — Progress Notes (Signed)
Post Partum Day #1 Subjective: no complaints  Objective: Blood pressure 147/87, pulse 72, temperature 98.1 F (36.7 C), temperature source Oral, resp. rate 18, height  (1.676 m), weight 85.078 kg (187 lb 9 oz), last menstrual period 03/30/2014, SpO2 100 %, unknown if currently breastfeeding.  Physical Exam:  General: alert Lochia: appropriate Uterine Fundus: firm   Recent Labs  01/23/15 1400  HGB 12.3  HCT 36.6    Assessment/Plan: Plan for discharge tomorrow.  BP still up a bit but improved, will monitor today.   LOS: 1 day   Faith Branan D 01/24/2015, 10:29 AM

## 2015-01-24 NOTE — Anesthesia Postprocedure Evaluation (Signed)
  Anesthesia Post-op Note  Patient: Sara Bell  Procedure(s) Performed: * No procedures listed *  Patient Location: Mother/Baby  Anesthesia Type:Epidural  Level of Consciousness: awake and alert   Airway and Oxygen Therapy: Patient Spontanous Breathing  Post-op Pain: mild  Post-op Assessment: Post-op Vital signs reviewed, Patient's Cardiovascular Status Stable, Respiratory Function Stable, No signs of Nausea or vomiting, Pain level controlled, No headache, Spinal receding and Patient able to bend at knees              Post-op Vital Signs: Reviewed  Last Vitals:  Filed Vitals:   01/24/15 0700  BP: 147/87  Pulse: 72  Temp: 36.7 C  Resp: 18    Complications: No apparent anesthesia complications

## 2015-01-25 ENCOUNTER — Ambulatory Visit: Payer: Self-pay

## 2015-01-25 MED ORDER — IBUPROFEN 600 MG PO TABS: 600.0000 mg | ORAL_TABLET | Freq: Four times a day (QID) | ORAL | Status: DC

## 2015-01-25 NOTE — Progress Notes (Addendum)
RN in room and pt B/P ELEVATED TO 163/87, HR` 58,  RESP 20, O2 SAT-98, TEMP-98. PT ASYMPTOMATIC. NO H/A . No dizziness, no blurred vision. Reflexes 1 plus in lower extremities. Mild pitting edema 1 plus.

## 2015-01-25 NOTE — Progress Notes (Signed)
DR. Jarold Song NOTIFIED OF B/P CHANGES AND ASYMPTOMATIC. NO NEW ORDERS OBTAINED. DR. Charline Bills "I WILL ADDRESS THE B/P WHEN I COME IN TO THE HOSPITAL"

## 2015-01-25 NOTE — Discharge Summary (Signed)
Obstetric Discharge Summary Reason for Admission: onset of labor Prenatal Procedures: none Intrapartum Procedures: spontaneous vaginal delivery Postpartum Procedures: none Complications-Operative and Postpartum: none HEMOGLOBIN  Date Value Ref Range Status  01/23/2015 12.3 12.0 - 15.0 g/dL Final   HCT  Date Value Ref Range Status  01/23/2015 36.6 36.0 - 46.0 % Final    Physical Exam:  General: alert Lochia: appropriate Uterine Fundus: firm   Discharge Diagnoses: Term Pregnancy-delivered and gestational hypertension  Discharge Information: Date: 01/25/2015 Activity: pelvic rest Diet: routine Medications: Ibuprofen Condition: stable Instructions: refer to practice specific booklet Discharge to: home Follow-up Information    Follow up with Kenzo Ozment D, MD. Schedule an appointment as soon as possible for a visit in 6 weeks.   Specialty:  Obstetrics and Gynecology   Contact information:   [address], [address] [place] [address] [phone]       Newborn Data: Live born female  Birth Weight: 7 lb 0.5 oz (3190 g) APGAR: 8, 9  Home with mother.  Sara Bell 01/25/2015, 8:51 AM

## 2015-01-25 NOTE — Progress Notes (Signed)
PPD #2 Feels fine, no problems Afeb, VSS, BP labile 130-160/60-90 Fundus firm BP occasionally mildly elevated, not consistently enough to treat for now.  Will d/c home, call for any sx, check BP in the office in one week.

## 2015-01-25 NOTE — Lactation Note (Signed)
This note was copied from the chart of [name]. Lactation Consultation Note  Patient Name: [name] [id number] Date: 01/25/2015  [facility] reviewed latching basics with mom and the importance of depth at the breast to enhance flow to baby. And to prevent any further tenderness, LC instructed  Mom on the use shells and hand pump.  And comfort gels. Sore nipple and engorgement prevention and tx reviewed.  Baby latched well with depth  , multiply swallows noted and increased with breast compressions. On the left breast , baby fed for 8 mins and released on his own. Mom had mentioned  Left nipple underneath nipple was getting tender and showed LC a crack. LC had mom latch in the cross cradle and mom was comfortable the whole feeding. Nipple  Appeared normal shape when baby released. Baby asleep .  Mother informed of post-discharge support and given phone number to the lactation department, including services for phone call assistance; out-patient appointments; and breastfeeding support group. List of other breastfeeding resources in the community given in the handout. Encouraged mother to call for problems or concerns related to breastfeeding.   Maternal Data    Feeding    [facility] Score/Interventions                      Lactation Tools Discussed/Used     Consult Status      Sara Bell 01/25/2015, 2:36 PM

## 2015-01-25 NOTE — Discharge Instructions (Signed)
As per discharge pamphlet °

## 2015-01-27 ENCOUNTER — Inpatient Hospital Stay (HOSPITAL_COMMUNITY): Admission: RE | Admit: 2015-01-27 | Payer: No Typology Code available for payment source | Source: Ambulatory Visit

## 2015-01-29 ENCOUNTER — Inpatient Hospital Stay (HOSPITAL_COMMUNITY)
Admission: AD | Admit: 2015-01-29 | Discharge: 2015-01-31 | DRG: 776 | Disposition: A | Discharge status: Home / Self Care | Payer: Medicaid Other | Source: Ambulatory Visit | Attending: Obstetrics and Gynecology | Admitting: Obstetrics and Gynecology

## 2015-01-29 DIAGNOSIS — E876 Hypokalemia: Secondary | ICD-10-CM | POA: Diagnosis present

## 2015-01-29 DIAGNOSIS — O152 Eclampsia in the puerperium: Secondary | ICD-10-CM | POA: Diagnosis present

## 2015-01-29 DIAGNOSIS — R569 Unspecified convulsions: Secondary | ICD-10-CM | POA: Diagnosis present

## 2015-01-29 DIAGNOSIS — O159 Eclampsia, unspecified as to time period: Secondary | ICD-10-CM | POA: Diagnosis present

## 2015-01-29 HISTORY — DX: Unspecified convulsions: R56.9

## 2015-01-29 LAB — COMPREHENSIVE METABOLIC PANEL
ALBUMIN: 3.7 g/dL (ref 3.5–5.0)
ALT: 51 U/L (ref 14–54)
ANION GAP: 11 (ref 5–15)
AST: 39 U/L (ref 15–41)
Alkaline Phosphatase: 104 U/L (ref 38–126)
BILIRUBIN TOTAL: 1.3 mg/dL — AB (ref 0.3–1.2)
BUN: 11 mg/dL (ref 6–20)
CHLORIDE: 109 mmol/L (ref 101–111)
CO2: 21 mmol/L — ABNORMAL LOW (ref 22–32)
Calcium: 8.8 mg/dL — ABNORMAL LOW (ref 8.9–10.3)
Creatinine, Ser: 0.77 mg/dL (ref 0.44–1.00)
GFR calc Af Amer: >60 mL/min (ref >60)
GFR calc non Af Amer: >60 mL/min (ref >60)
GLUCOSE: 80 mg/dL (ref 65–99)
POTASSIUM: 2.9 mmol/L — AB (ref 3.5–5.1)
SODIUM: 141 mmol/L (ref 135–145)
TOTAL PROTEIN: 7.5 g/dL (ref 6.5–8.1)

## 2015-01-29 LAB — CBC
HEMATOCRIT: 41.6 % (ref 36.0–46.0)
Hemoglobin: 14 g/dL (ref 12.0–15.0)
MCH: 30.4 pg (ref 26.0–34.0)
MCHC: 33.7 g/dL (ref 30.0–36.0)
MCV: 90.4 fL (ref 78.0–100.0)
PLATELETS: 216 10*3/uL (ref 150–400)
RBC: 4.6 MIL/uL (ref 3.87–5.11)
RDW: 14.4 % (ref 11.5–15.5)
WBC: 8.5 10*3/uL (ref 4.0–10.5)

## 2015-01-29 LAB — MAGNESIUM: Magnesium: 3.6 mg/dL — ABNORMAL HIGH (ref 1.7–2.4)

## 2015-01-29 LAB — URIC ACID: Uric Acid, Serum: 11.3 mg/dL — ABNORMAL HIGH (ref 2.3–6.6)

## 2015-01-29 LAB — PROTEIN / CREATININE RATIO, URINE
CREATININE, URINE: 26 mg/dL
PROTEIN CREATININE RATIO: 1 mg/mg{creat} — AB (ref 0.00–0.15)
TOTAL PROTEIN, URINE: 26 mg/dL

## 2015-01-29 LAB — LACTATE DEHYDROGENASE: LDH: 243 U/L — ABNORMAL HIGH (ref 98–192)

## 2015-01-29 MED ORDER — LABETALOL HCL 5 MG/ML IV SOLN
20.0000 mg | INTRAVENOUS | Status: DC | PRN
  Administered 2015-01-29 (×2): 20 mg via INTRAVENOUS
  Filled 2015-01-29 (×2): qty 4

## 2015-01-29 MED ORDER — HYDRALAZINE HCL 20 MG/ML IJ SOLN
10.0000 mg | Freq: Once | INTRAMUSCULAR | Status: AC | PRN
  Administered 2015-01-31: 10 mg via INTRAVENOUS
  Filled 2015-01-29: qty 1

## 2015-01-29 MED ORDER — MAGNESIUM SULFATE 50 % IJ SOLN
2.0000 g/h | INTRAVENOUS | Status: DC
  Filled 2015-01-29: qty 80

## 2015-01-29 MED ORDER — MAGNESIUM SULFATE 50 % IJ SOLN
2.0000 g/h | INTRAVENOUS | Status: DC
  Administered 2015-01-29: 3 g/h via INTRAVENOUS
  Administered 2015-01-29: 2 g/h via INTRAVENOUS
  Filled 2015-01-29: qty 80

## 2015-01-29 MED ORDER — DEXTROSE-NACL 5-0.45 % IV SOLN
INTRAVENOUS | Status: DC
  Administered 2015-01-29 (×2): via INTRAVENOUS

## 2015-01-29 MED ORDER — SIMETHICONE 80 MG PO CHEW: 80.0000 mg | CHEWABLE_TABLET | ORAL | Status: DC | PRN

## 2015-01-29 MED ORDER — LANOLIN HYDROUS EX OINT: TOPICAL_OINTMENT | CUTANEOUS | Status: DC | PRN

## 2015-01-29 MED ORDER — LACTATED RINGERS IV SOLN
INTRAVENOUS | Status: DC
  Administered 2015-01-29 (×2): via INTRAVENOUS

## 2015-01-29 MED ORDER — OXYCODONE-ACETAMINOPHEN 5-325 MG PO TABS: 2.0000 | ORAL_TABLET | ORAL | Status: DC | PRN

## 2015-01-29 MED ORDER — IBUPROFEN 600 MG PO TABS
600.0000 mg | ORAL_TABLET | Freq: Four times a day (QID) | ORAL | Status: DC
  Administered 2015-01-29 – 2015-01-31 (×7): 600 mg via ORAL
  Filled 2015-01-29 (×7): qty 1

## 2015-01-29 MED ORDER — LORAZEPAM 2 MG/ML IJ SOLN
1.0000 mg | Freq: Once | INTRAMUSCULAR | Status: AC
  Administered 2015-01-29: 1 mg via INTRAVENOUS

## 2015-01-29 MED ORDER — MAGNESIUM SULFATE BOLUS VIA INFUSION
4.0000 g | Freq: Once | INTRAVENOUS | Status: DC
  Filled 2015-01-29: qty 500

## 2015-01-29 MED ORDER — LABETALOL HCL 200 MG PO TABS
200.0000 mg | ORAL_TABLET | Freq: Two times a day (BID) | ORAL | Status: DC
  Administered 2015-01-29: 200 mg via ORAL
  Filled 2015-01-29 (×2): qty 1

## 2015-01-29 MED ORDER — OXYCODONE-ACETAMINOPHEN 5-325 MG PO TABS: 1.0000 | ORAL_TABLET | ORAL | Status: DC | PRN

## 2015-01-29 MED ORDER — ACETAMINOPHEN 325 MG PO TABS
650.0000 mg | ORAL_TABLET | ORAL | Status: DC | PRN
Start: 2015-01-29 — End: 2015-01-31
  Administered 2015-01-29: 650 mg via ORAL
  Filled 2015-01-29 (×2): qty 2

## 2015-01-29 MED ORDER — PRENATAL MULTIVITAMIN CH
1.0000 | ORAL_TABLET | Freq: Every day | ORAL | Status: DC
  Administered 2015-01-30: 1 via ORAL
  Filled 2015-01-29: qty 1

## 2015-01-29 MED ORDER — MAGNESIUM SULFATE 50 % IJ SOLN
3.0000 g/h | INTRAVENOUS | Status: DC
  Administered 2015-01-29: 3 g/h via INTRAVENOUS
  Filled 2015-01-29: qty 80

## 2015-01-29 MED ORDER — MAGNESIUM SULFATE BOLUS VIA INFUSION
2.0000 g | Freq: Once | INTRAVENOUS | Status: AC
  Administered 2015-01-29: 2 g via INTRAVENOUS
  Filled 2015-01-29: qty 500

## 2015-01-29 NOTE — MAU Provider Note (Signed)
History     CSN: [id number]  Arrival date and time: 01/29/15 0830   None     Chief Complaint  Patient presents with  . Seizures    Two seizures prior to arrival   HPI   Ms.Ann Henrine Screws is a 29 y.o. female G2P2002, status post SVD on 8/13. Patient presents via EMS after two seizures prior to arrival to MAU.   The Patients BP was inconsistently elevated following delivery, however the patient did not have preeclampsia with this pregnancy or her prior pregnancy. She has never been on BP medication. She does not recall the events that led to her seizure this morning. She does not remember the seizure occuring. The patient is now present with her significant other, who at the time of the seizure was at work. The patient denies pain at this time, denies blurred vision. She does not recall injuring her extremities during the fall. She does have a small amount of blood on her pillow in which appears she bit her tongue. The patient denies pain in her mouth.   OB History    Gravida Para Term Preterm AB TAB SAB Ectopic Multiple Living   2 2 2       0 2      Past Medical History  Diagnosis Date  . Hx of varicella   . Hx of chlamydia infection   . Hypertension     Past Surgical History  Procedure Laterality Date  . Knee arthroscopy with anterior cruciate ligament (acl) repair      r/knee  . Other surgical history Left     Implanon removed    Family History  Problem Relation Age of Onset  . Hypertension Mother   . Hypertension Father     Social History  Substance Use Topics  . Smoking status: Never Smoker   . Smokeless tobacco: Never Used  . Alcohol Use: No    Allergies: No Known Allergies  Prescriptions prior to admission  Medication Sig Dispense Refill Last Dose  . cyclobenzaprine (FLEXERIL) 10 MG tablet Take 1 tablet (10 mg total) by mouth 2 (two) times daily as needed for muscle spasms. (Patient not taking: Reported on 01/23/2015) 20 tablet 0   .  diphenhydramine-acetaminophen (TYLENOL PM) 25-500 MG TABS Take 2 tablets by mouth at bedtime as needed.   01/22/2015 at Unknown time  . ibuprofen (ADVIL,MOTRIN) 600 MG tablet Take 1 tablet (600 mg total) by mouth every 6 (six) hours. 30 tablet 0   . Prenatal Vit-Fe Fumarate-FA (PRENATAL MULTIVITAMIN) TABS tablet Take 1 tablet by mouth daily at 12 noon.   01/21/2015 at Unknown time   Results for orders placed or performed during the hospital encounter of 01/29/15 (from the past 48 hour(s))  CBC     Status: None   Collection Time: 01/29/15  8:44 AM  Result Value Ref Range   WBC 8.5 4.0 - 10.5 K/uL   RBC 4.60 3.87 - 5.11 MIL/uL   Hemoglobin 14.0 12.0 - 15.0 g/dL   HCT 41.6 36.0 - 46.0 %   MCV 90.4 78.0 - 100.0 fL   MCH 30.4 26.0 - 34.0 pg   MCHC 33.7 30.0 - 36.0 g/dL   RDW 14.4 11.5 - 15.5 %   Platelets 216 150 - 400 K/uL  Comprehensive metabolic panel     Status: Abnormal   Collection Time: 01/29/15  8:50 AM  Result Value Ref Range   Sodium 141 135 - 145 mmol/L   Potassium 2.9 (L) 3.5 -  5.1 mmol/L   Chloride 109 101 - 111 mmol/L   CO2 21 (L) 22 - 32 mmol/L   Glucose, Bld 80 65 - 99 mg/dL   BUN 11 6 - 20 mg/dL   Creatinine, Ser 0.77 0.44 - 1.00 mg/dL   Calcium 8.8 (L) 8.9 - 10.3 mg/dL   Total Protein 7.5 6.5 - 8.1 g/dL   Albumin 3.7 3.5 - 5.0 g/dL   AST 39 15 - 41 U/L   ALT 51 14 - 54 U/L   Alkaline Phosphatase 104 38 - 126 U/L   Total Bilirubin 1.3 (H) 0.3 - 1.2 mg/dL   GFR calc non Af Amer >60 >60 mL/min   GFR calc Af Amer >60 >60 mL/min    Comment: (NOTE) The eGFR has been calculated using the CKD EPI equation. This calculation has not been validated in all clinical situations. eGFR's persistently <60 mL/min signify possible Chronic Kidney Disease.    Anion gap 11 5 - 15  Uric acid     Status: Abnormal   Collection Time: 01/29/15  8:50 AM  Result Value Ref Range   Uric Acid, Serum 11.3 (H) 2.3 - 6.6 mg/dL  Lactate dehydrogenase     Status: Abnormal   Collection Time:  01/29/15  8:50 AM  Result Value Ref Range   LDH 243 (H) 98 - 192 U/L    Review of Systems  Constitutional: Negative for fever and chills.  Eyes: Negative for blurred vision and double vision.  Gastrointestinal: Negative for abdominal pain.  Neurological: Positive for seizures. Negative for headaches.   Physical Exam   Blood pressure 128/76, pulse 98, resp. rate 20, last menstrual period 03/30/2014, unknown if currently breastfeeding.  Physical Exam  Constitutional: She appears lethargic.  Respiratory: Effort normal. No respiratory distress.  Musculoskeletal: She exhibits edema.  Neurological: She has normal reflexes. She appears lethargic. She is disoriented. She displays no tremor and normal reflexes. She exhibits normal muscle tone. She displays no seizure activity. GCS eye subscore is 4. GCS verbal subscore is 5. GCS motor subscore is 6.  Negative clonus   Skin: Skin is warm.    MAU Course  Procedures  None  MDM 0845: Discussed patient with Dr. Marvel Plan; Mag started 2 gram bolus with 2 gram an hour PIH labs Dr. Marvel Plan to MAU to see patient.   Assessment and Plan   A:   Likely postpartum eclampsia   P:   Admit to ICU for magnesium  Labs pending    Lezlie Lye, NP 01/29/2015'8:53 AM

## 2015-01-29 NOTE — Progress Notes (Addendum)
PPD # 6 pp eclampsia Patient ID: Sara Bell, female   DOB: 1986-01-26, 29 y.o.   MRN: [id number] Pt looking much better. She is awake and alert, HA of earlier improved with one ibuprofen feels thirsty Now almost 8 hours seizure free, will try to advance to clears, then soft diet. Check labs in AM and urine prot/creat ratio  Good diuresis today  Begin labetalol  po BID as has required several IV doses through day Will continue magnesium until 24 hours seizure free. Sister in room with baby and feeding breastmilk and formula mother has pumped.

## 2015-01-29 NOTE — Progress Notes (Signed)
Patient arrived to unit via wheelchair from MAU.  Patient alert and asked for a glass of water.  Patient educated that she was on a nothing by mouth diet order at this time.  Patient self from bed to chair.  Slight unsteady gait.  Patient lying in bed looking toward the window.  Patient asked if she was ok.  Patient did not respond.  RN spoke to patient.  Patient staring and then began to have seizure.  Patient placed on side.  Patient bleeding from side of mouth. MAU RN stated patient had previously bit her tongue during prior seizure.  ELink button pressed and nurse notified.  Dr. Senaida Ores notified.  Gave order for stat Mag level.  Seizure lasted approximately 2-3 minutes.  Patient opening eyes when spoken to.  Dr. Senaida Ores in hospital on way to see patient.  Patient's husband at bedside.

## 2015-01-29 NOTE — Progress Notes (Signed)
Dr. Senaida Ores notified of patient's Magnesium level, CMP and LDH.  No new orders at this time.  Stated to leave at Magnesium 3 g/hr.

## 2015-01-29 NOTE — MAU Note (Addendum)
Patient arrive via ambulance.  Patient is not responding to verbal ques and unable to answer questions at this time. Report recieved from EMS provider. Reports one seizure in the home and one in ambulance.  Given 2.5 g Mediazolam and 2 g Mag Sal in 50 cc NS in ambulance IV. IV started in L Hand by EMS provider prior to arrival.

## 2015-01-29 NOTE — Progress Notes (Addendum)
   01/29/15 1327 01/29/15 1328  Vitals  BP --  (!) 147/85 mmHg  MAP (mmHg) --  99  Pulse Rate 86 --   Oxygen Therapy  SpO2 94 % --    Blood pressure recheck after Labetalol

## 2015-01-29 NOTE — Progress Notes (Signed)
   01/29/15 1623  Vitals  BP (!) 155/92 mmHg  MAP (mmHg) 107  Pulse Rate 75  Resp 18  Oxygen Therapy  SpO2 100 %  O2 Device Room Air   Recheck after Labetalol

## 2015-01-29 NOTE — Lactation Note (Signed)
Lactation Consultation Note  Patient Name: Cece Milhouse [id number] Date: 01/29/2015   Baby 62 days old. Mom readmitted for seizures. Mom cannot remember if she had been pumping or not, but sister reports that mom had breast milk in the refrigerator. Set mom up with DEBP and got her started pumping. Mom able to pump over an ounce of EBM. Mom sleepy and has headache, so discussed pumping schedule with sister as well who states that mom's "boyfriend" will be here this evening and she will relay information to him. Enc mom to pump 8 times/24 hours for 15 minutes. Enc mom to pump as she can tonight, and then pump more often starting in the morning.  Mom is an experience BF mom of her first child a 29-year-old. Mom's right nipple looks cracked. Enc mom to use EBM on nipples for healing. Mom given comfort gels with instructions. Enc mom to maintain a deep latch and ask for assistance with latching as needed.     Maternal Data    Feeding    [facility] Score/Interventions                      Lactation Tools Discussed/Used     Consult Status      Geralynn Ochs 01/29/2015, 4:58 PM

## 2015-01-29 NOTE — H&P (Signed)
Sara Bell is an 29 y.o. female  G2P2  who presents by EMS with seizures and probable postpartum eclampsia.  S/p NSVD on 01/23/15 and prenatal course complicated by mild PIH in labor, but no other signs of preeclampsia.  BP postpartum was reasonable on no medications highest diastolic in the 90 range and normal at other times.  The pt family reports her 53 y/o nephew called 911 and her sister when she had a seizure about 830am.  The patient does not remember anything from this morning.  She denies any pain in extremities from falling, does have a cut in her mouth, but no pain.  She currently denies HA or any other symptoms, still appears sleepy but responding appropriately.  She received a 2gram magnesium bolus by EMS and another 2 gram bolus on arrival to MAU. Her partner states she has been well at home with no c/o prior to this AM except for cramping with breastfeeding.  Pertinent Gynecological History:  OB History: NSVD x 2   Menstrual History:  Patient's last menstrual period was 03/30/2014 (exact date).    Past Medical History  Diagnosis Date  . Hx of varicella   . Hx of chlamydia infection   . Hypertension     Past Surgical History  Procedure Laterality Date  . Knee arthroscopy with anterior cruciate ligament (acl) repair      r/knee  . Other surgical history Left     Implanon removed    Family History  Problem Relation Age of Onset  . Hypertension Mother   . Hypertension Father     Social History:  reports that she has never smoked. She has never used smokeless tobacco. She reports that she does not drink alcohol or use illicit drugs.  Allergies: No Known Allergies  Prescriptions prior to admission  Medication Sig Dispense Refill Last Dose  . cyclobenzaprine (FLEXERIL) 10 MG tablet Take 1 tablet (10 mg total) by mouth 2 (two) times daily as needed for muscle spasms. (Patient not taking: Reported on 01/23/2015) 20 tablet 0   . diphenhydramine-acetaminophen (TYLENOL  PM) 25-500 MG TABS Take 2 tablets by mouth at bedtime as needed.   01/22/2015 at Unknown time  . ibuprofen (ADVIL,MOTRIN) 600 MG tablet Take 1 tablet (600 mg total) by mouth every 6 (six) hours. 30 tablet 0   . Prenatal Vit-Fe Fumarate-FA (PRENATAL MULTIVITAMIN) TABS tablet Take 1 tablet by mouth daily at 12 noon.   01/21/2015 at Unknown time    Review of Systems  Gastrointestinal: Negative for abdominal pain.  Neurological: Positive for seizures. Negative for headaches.    Blood pressure 134/68, pulse 94, resp. rate 21, last menstrual period 03/30/2014, unknown if currently breastfeeding. Physical Exam  Constitutional: She is oriented to person, place, and time. She appears well-developed.  Cardiovascular: Normal rate.   Respiratory: Effort normal.  GI: Soft.  Genitourinary: Vagina normal.  Neurological: She is oriented to person, place, and time. She has normal reflexes.  Psychiatric:  Slightly sedated appearing but answering questions appropriately    Results for orders placed or performed during the hospital encounter of 01/29/15 (from the past 24 hour(s))  CBC     Status: None   Collection Time: 01/29/15  8:44 AM  Result Value Ref Range   WBC 8.5 4.0 - 10.5 K/uL   RBC 4.60 3.87 - 5.11 MIL/uL   Hemoglobin 14.0 12.0 - 15.0 g/dL   HCT 16.1 09.6 - 04.5 %   MCV 90.4 78.0 - 100.0 fL  MCH 30.4 26.0 - 34.0 pg   MCHC 33.7 30.0 - 36.0 g/dL   RDW 16.1 09.6 - 04.5 %   Platelets 216 150 - 400 K/uL    No results found.  Assessment/Plan: Pt admitted with probable pp eclampsia for seizure prophylaxis and close monitoring.  CBC WNL.  Awaiting rest of labs.  Will place on seizure precautions and NPO for now.  BP currently 160/80, will treat with labetalol/hydralazine as needed.    Oliver Pila 01/29/2015, 9:11 AM

## 2015-01-29 NOTE — Progress Notes (Signed)
   01/29/15 1308  Vitals  BP (!) 165/103 mmHg  MAP (mmHg) 117  Pulse Rate 80  Oxygen Therapy  SpO2 95 %   Labetalol 20 mg IV given

## 2015-01-29 NOTE — Progress Notes (Signed)
1640 - Dr. Senaida Ores notified of patient's VS and c/o headache.  Dr. Senaida Ores stated patient may receive her 1800 dose of Motrin now with a small sip of water.

## 2015-01-29 NOTE — Progress Notes (Signed)
   01/29/15 1600  Vitals  BP (!) 162/105 mmHg  MAP (mmHg) 119  Pulse Rate 83  Oxygen Therapy  SpO2 100 %  O2 Device Room Air   Labetalol given

## 2015-01-29 NOTE — Progress Notes (Signed)
Patient ID: Sara Bell, female   DOB: 11/25/1985, 29 y.o.   MRN: [id number] Upon arrival to AICU pt had another 3 min seizure on magnesium Now postictal and moving restlessly Airway good  O2 sat 96% Magnesium at 2g/hour--will increase to 3g/hour and get stat magnesium level, may just not be therapeutic yet BP creeping up 160/100's  Continue seizure precautions and NPO Give ativan 1 mg now Will adjust magnesium  If seizures persist, will need scan of head Begin labetalol protocol

## 2015-01-30 ENCOUNTER — Encounter (HOSPITAL_COMMUNITY): Payer: Self-pay | Admitting: *Deleted

## 2015-01-30 LAB — COMPREHENSIVE METABOLIC PANEL
ALT: 42 U/L (ref 14–54)
AST: 27 U/L (ref 15–41)
Albumin: 3.3 g/dL — ABNORMAL LOW (ref 3.5–5.0)
Alkaline Phosphatase: 98 U/L (ref 38–126)
Anion gap: 9 (ref 5–15)
BILIRUBIN TOTAL: 1.4 mg/dL — AB (ref 0.3–1.2)
BUN: 6 mg/dL (ref 6–20)
CO2: 25 mmol/L (ref 22–32)
Calcium: 6.7 mg/dL — ABNORMAL LOW (ref 8.9–10.3)
Chloride: 104 mmol/L (ref 101–111)
Creatinine, Ser: 0.7 mg/dL (ref 0.44–1.00)
Glucose, Bld: 110 mg/dL — ABNORMAL HIGH (ref 65–99)
Potassium: 2.3 mmol/L — CL (ref 3.5–5.1)
Sodium: 138 mmol/L (ref 135–145)
TOTAL PROTEIN: 6.8 g/dL (ref 6.5–8.1)

## 2015-01-30 LAB — CBC
HEMATOCRIT: 42.1 % (ref 36.0–46.0)
Hemoglobin: 14.5 g/dL (ref 12.0–15.0)
MCH: 30.4 pg (ref 26.0–34.0)
MCHC: 34.4 g/dL (ref 30.0–36.0)
MCV: 88.3 fL (ref 78.0–100.0)
Platelets: 234 10*3/uL (ref 150–400)
RBC: 4.77 MIL/uL (ref 3.87–5.11)
RDW: 14.5 % (ref 11.5–15.5)
WBC: 9.3 10*3/uL (ref 4.0–10.5)

## 2015-01-30 LAB — MAGNESIUM: MAGNESIUM: 7.2 mg/dL — AB (ref 1.7–2.4)

## 2015-01-30 MED ORDER — LABETALOL HCL 300 MG PO TABS
300.0000 mg | ORAL_TABLET | Freq: Two times a day (BID) | ORAL | Status: DC
  Administered 2015-01-30 (×2): 300 mg via ORAL
  Filled 2015-01-30 (×3): qty 1

## 2015-01-30 MED ORDER — POTASSIUM CHLORIDE CRYS ER 20 MEQ PO TBCR
40.0000 meq | EXTENDED_RELEASE_TABLET | Freq: Two times a day (BID) | ORAL | Status: DC
  Administered 2015-01-30 (×2): 40 meq via ORAL
  Filled 2015-01-30 (×3): qty 2

## 2015-01-30 NOTE — Progress Notes (Signed)
Patient ID: Sara Bell, female   DOB: 1985/09/20, 29 y.o.   MRN: [id number] PPD #6 eclampsia  Pt looks much better this AM  Up getting cleaned up.  States legs still feel weak so using bedside commode.  No seizures in >24 hours so MgSO4 d/c Increased labetalol to  po BID and BP improved so far.  K+ low at 2.3 this AM so started K-Dur, will recheck in AM Will continue to follow pt closely, still has no memory of yesterday and also the day before that.

## 2015-01-31 LAB — CBC
HCT: 40.6 % (ref 36.0–46.0)
HEMOGLOBIN: 13.7 g/dL (ref 12.0–15.0)
MCH: 30.3 pg (ref 26.0–34.0)
MCHC: 33.7 g/dL (ref 30.0–36.0)
MCV: 89.8 fL (ref 78.0–100.0)
Platelets: 241 10*3/uL (ref 150–400)
RBC: 4.52 MIL/uL (ref 3.87–5.11)
RDW: 14.7 % (ref 11.5–15.5)
WBC: 8.8 10*3/uL (ref 4.0–10.5)

## 2015-01-31 LAB — POTASSIUM: Potassium: 3.3 mmol/L — ABNORMAL LOW (ref 3.5–5.1)

## 2015-01-31 LAB — COMPREHENSIVE METABOLIC PANEL
ALBUMIN: 3 g/dL — AB (ref 3.5–5.0)
ALT: 30 U/L (ref 14–54)
ANION GAP: 9 (ref 5–15)
AST: 16 U/L (ref 15–41)
Alkaline Phosphatase: 82 U/L (ref 38–126)
BUN: 15 mg/dL (ref 6–20)
CO2: 24 mmol/L (ref 22–32)
Calcium: 8.5 mg/dL — ABNORMAL LOW (ref 8.9–10.3)
Chloride: 107 mmol/L (ref 101–111)
Creatinine, Ser: 0.71 mg/dL (ref 0.44–1.00)
GFR calc Af Amer: >60 mL/min (ref >60)
GFR calc non Af Amer: >60 mL/min (ref >60)
GLUCOSE: 82 mg/dL (ref 65–99)
POTASSIUM: 2.7 mmol/L — AB (ref 3.5–5.1)
SODIUM: 140 mmol/L (ref 135–145)
Total Bilirubin: 1.1 mg/dL (ref 0.3–1.2)
Total Protein: 6.4 g/dL — ABNORMAL LOW (ref 6.5–8.1)

## 2015-01-31 MED ORDER — POTASSIUM CHLORIDE CRYS ER 20 MEQ PO TBCR: 40.0000 meq | EXTENDED_RELEASE_TABLET | Freq: Three times a day (TID) | ORAL | Status: AC

## 2015-01-31 MED ORDER — LABETALOL HCL 300 MG PO TABS: 300.0000 mg | ORAL_TABLET | Freq: Three times a day (TID) | ORAL | Status: AC

## 2015-01-31 MED ORDER — POTASSIUM CHLORIDE 10 MEQ/100ML IV SOLN
10.0000 meq | INTRAVENOUS | Status: AC
  Administered 2015-01-31 (×2): 10 meq via INTRAVENOUS
  Filled 2015-01-31 (×2): qty 100

## 2015-01-31 MED ORDER — POTASSIUM CHLORIDE CRYS ER 20 MEQ PO TBCR
40.0000 meq | EXTENDED_RELEASE_TABLET | Freq: Three times a day (TID) | ORAL | Status: DC
  Administered 2015-01-31: 40 meq via ORAL
  Filled 2015-01-31 (×4): qty 2

## 2015-01-31 MED ORDER — SODIUM CHLORIDE 0.9 % IJ SOLN: 3.0000 mL | INTRAMUSCULAR | Status: DC | PRN

## 2015-01-31 MED ORDER — LABETALOL HCL 300 MG PO TABS
300.0000 mg | ORAL_TABLET | Freq: Three times a day (TID) | ORAL | Status: DC
  Administered 2015-01-31: 300 mg via ORAL
  Filled 2015-01-31 (×4): qty 1

## 2015-01-31 NOTE — Progress Notes (Signed)
   01/31/15 0055  Vitals  BP (!) 164/95 mmHg  MAP (mmHg) 111  BP Location Right Arm  BP Method Automatic  Pulse Rate 66  Pulse Rate Source Monitor  Oxygen Therapy  SpO2 97 %  O2 Device Room Air  Hydralazine 10 mg IV administered as orderd. We will continue to monitor.
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
## 2015-01-31 NOTE — Progress Notes (Signed)
   01/31/15 0130  Vitals  BP (!) 147/82 mmHg  MAP (mmHg) 98  BP Location Right Arm  BP Method Automatic  Patient Position (if appropriate) Lying  Pulse Rate 75  Pulse Rate Source Monitor  Oxygen Therapy  SpO2 98 %  O2 Device Room Air  Medication effective. We will continue to monitor.

## 2015-01-31 NOTE — Progress Notes (Addendum)
Patient ID: Sara Bell, female   DOB: Mar 28, 1986, 29 y.o.   MRN: [id number] Pt looking very good this AM.  Smiling and back to her normal affect.  Wants to go home She has been able to ambulate without feeling weak.  No seizures or HA in 48 hours  BP still elevated on labetalol 150/90's max, some normal, will increase to TID, has still been on BID D/w pt importance of continuing labetalol 3X daily at home.  Her sister is in healthcare and will help her remember  UOP good Potassium improved but still low at 2.7, will increase KDur to TID and give 2 runs of IV potassium before d/c --recheck K+ prior to d/c Other labs WNL  Abdomen soft NT No edema  Pt s/p pp eclampsia, now seizure free for 48 hours, off magnesium 24 hours BP still elevated on labetalol  po BID, will increase to TID and will need BP recheck in 3 days in office Plan d/c home this PM  when potassium runs done if level closer to 3 on recheck

## 2016-07-19 ENCOUNTER — Emergency Department (HOSPITAL_BASED_OUTPATIENT_CLINIC_OR_DEPARTMENT_OTHER)
Admission: EM | Admit: 2016-07-19 | Discharge: 2016-07-19 | Disposition: A | Discharge status: Home / Self Care | Payer: BC Managed Care – PPO | Attending: Emergency Medicine | Admitting: Emergency Medicine

## 2016-07-19 ENCOUNTER — Encounter (HOSPITAL_BASED_OUTPATIENT_CLINIC_OR_DEPARTMENT_OTHER): Payer: Self-pay

## 2016-07-19 ENCOUNTER — Emergency Department (HOSPITAL_BASED_OUTPATIENT_CLINIC_OR_DEPARTMENT_OTHER): Payer: BC Managed Care – PPO

## 2016-07-19 DIAGNOSIS — I10 Essential (primary) hypertension: Secondary | ICD-10-CM | POA: Diagnosis not present

## 2016-07-19 DIAGNOSIS — R0789 Other chest pain: Secondary | ICD-10-CM | POA: Diagnosis present

## 2016-07-19 LAB — CBC
HCT: 37.2 % (ref 36.0–46.0)
Hemoglobin: 12.2 g/dL (ref 12.0–15.0)
MCH: 29.7 pg (ref 26.0–34.0)
MCHC: 32.8 g/dL (ref 30.0–36.0)
MCV: 90.5 fL (ref 78.0–100.0)
PLATELETS: 259 10*3/uL (ref 150–400)
RBC: 4.11 MIL/uL (ref 3.87–5.11)
RDW: 12.4 % (ref 11.5–15.5)
WBC: 7.5 10*3/uL (ref 4.0–10.5)

## 2016-07-19 LAB — BASIC METABOLIC PANEL
Anion gap: 4 — ABNORMAL LOW (ref 5–15)
BUN: 11 mg/dL (ref 6–20)
CO2: 27 mmol/L (ref 22–32)
CREATININE: 0.78 mg/dL (ref 0.44–1.00)
Calcium: 9 mg/dL (ref 8.9–10.3)
Chloride: 108 mmol/L (ref 101–111)
GFR calc Af Amer: >60 mL/min (ref >60)
Glucose, Bld: 98 mg/dL (ref 65–99)
POTASSIUM: 3.4 mmol/L — AB (ref 3.5–5.1)
SODIUM: 139 mmol/L (ref 135–145)

## 2016-07-19 LAB — TROPONIN I

## 2016-07-19 MED ORDER — IBUPROFEN 400 MG PO TABS
400.0000 mg | ORAL_TABLET | Freq: Once | ORAL | Status: AC
  Administered 2016-07-19: 400 mg via ORAL
  Filled 2016-07-19: qty 1

## 2016-07-19 MED ORDER — POTASSIUM CHLORIDE CRYS ER 20 MEQ PO TBCR
40.0000 meq | EXTENDED_RELEASE_TABLET | Freq: Once | ORAL | Status: AC
  Administered 2016-07-19: 40 meq via ORAL
  Filled 2016-07-19: qty 2

## 2016-07-19 NOTE — Discharge Instructions (Signed)
Take Tylenol or Advil as directed for pain. Call the number on these instructions to get a primary care physician. Return if concern for any reason.

## 2016-07-19 NOTE — ED Triage Notes (Signed)
Pt c/o intermittent left side chest pain for a month that has been consistent for the last three days, no radiation to back, no n/v, states she is SOB, no acute distress at the time

## 2016-07-19 NOTE — ED Provider Notes (Addendum)
[facility] Provider Note   CSN: [id number] Arrival date & time: 07/19/16  1953  By signing my name below, I, Talbert NanPaul Grant, attest that this documentation has been prepared under the direction and in the presence of Doug SouSam Yerik Zeringue, MD. Electronically Signed: Talbert NanPaul Grant, Scribe. 07/19/16. 10:29 PM.   History   Chief Complaint Chief Complaint  Patient presents with  . Chest Pain    HPI Sara Bell is a 31 y.o. female with h/o eclampsia and HTN who presents to the Emergency Department complaining of gradual onset, worsening, mild upper left sidedParasternal nonradiating chest pain that has been worsening for the last 2 days, that started 1 month ago. Pt reports that movement of her left arm exacerbates the pain. Pain is improved by remaining still. Pt reports that nothing alleviates the pain. Pt has taken Tylenol today with no relief. No shortness of breath Pt is a nonsmoker and does not drink EtOH. Pt was on patch birth control, but in not any longer. Pt's child is 17 months. Pt is not breastfeeding. Pt denies cough, SOB. Denies hemoptysis denies recent trauma or hospitalization denies leg swelling  The history is provided by the patient. No language interpreter was used.    Past Medical History:  Diagnosis Date  . Hx of chlamydia infection   . Hx of varicella   . Hypertension   . Seizures [facility](HCC)     Patient Active Problem List   Diagnosis Date Noted  . Eclampsia 01/29/2015  . Normal pregnancy, repeat 01/23/2015  . SVD (spontaneous vaginal delivery) 01/23/2015  . Gestational hypertension w/o significant proteinuria in 3rd trimester 01/23/2015    Past Surgical History:  Procedure Laterality Date  . KNEE ARTHROSCOPY WITH ANTERIOR CRUCIATE LIGAMENT (ACL) REPAIR     r/knee  . OTHER SURGICAL HISTORY Left    Implanon removed    OB History    Gravida Para Term Preterm AB Living   2 2 2     2    SAB TAB Ectopic Multiple Live Births         0 2       Home  Medications    Prior to Admission medications   Medication Sig Start Date End Date Taking? Authorizing Provider  labetalol (NORMODYNE) 300 MG tablet Take 1 tablet (300 mg total) by mouth 3 (three) times daily. Patient not taking: Reported on 07/19/2016 01/31/15   Huel CoteKathy Richardson, MD  potassium chloride SA (K-DUR,KLOR-CON) 20 MEQ tablet Take 2 tablets (40 mEq total) by mouth 3 (three) times daily. Patient not taking: Reported on 07/19/2016 01/31/15   Huel CoteKathy Richardson, MD  Prenatal Vit-Fe Fumarate-FA (PRENATAL MULTIVITAMIN) TABS tablet Take 1 tablet by mouth daily at 12 noon.    Historical Provider, MD    Family History Family History  Problem Relation Age of Onset  . Hypertension Mother   . Hypertension Father     Social History Social History  Substance Use Topics  . Smoking status: Never Smoker  . Smokeless tobacco: Never Used  . Alcohol use No     Allergies   Patient has no known allergies.   Review of Systems Review of Systems  Constitutional: Negative.   HENT: Negative.   Respiratory: Negative.  Negative for cough and shortness of breath.   Cardiovascular: Positive for chest pain.  Gastrointestinal: Negative.   Musculoskeletal: Negative.   Skin: Negative.   Neurological: Negative.   Psychiatric/Behavioral: Negative.   All other systems reviewed and are negative.    Physical Exam Updated  Vital Signs BP 131/80 (BP Location: Right Arm)   Pulse 70   Temp 98.2 F (36.8 C)   Resp 18   Ht 5\' 5"  (1.651 m)   Wt 170 lb (77.1 kg)   LMP 07/06/2016   SpO2 99%   BMI 28.29 kg/m   Physical Exam  Constitutional: She appears well-developed and well-nourished.  HENT:  Head: Normocephalic and atraumatic.  Eyes: Conjunctivae are normal. Pupils are equal, round, and reactive to light.  Neck: Neck supple. No tracheal deviation present. No thyromegaly present.  Cardiovascular: Normal rate and regular rhythm.   No murmur heard. Pulmonary/Chest: Effort normal and breath  sounds normal. She exhibits tenderness.  Chest is tender at left parasternal area reproducing pain exactly. Pain is also reproduced reduced easily by forcible abduction of left shoulder  Abdominal: Soft. Bowel sounds are normal. She exhibits no distension. There is no tenderness.  Musculoskeletal: Normal range of motion. She exhibits no edema or tenderness.  Neurological: She is alert. Coordination normal.  Skin: Skin is warm and dry. No rash noted.  Psychiatric: She has a normal mood and affect.  Nursing note and vitals reviewed.    ED Treatments / Results   DIAGNOSTIC STUDIES: Oxygen Saturation is 100% on room air, normal by my interpretation.    COORDINATION OF CARE: 10:28 PM Discussed treatment plan with pt at bedside and pt agreed to plan, which includes rechecking blood pressure.  Labs (all labs ordered are listed, but only abnormal results are displayed) Labs Reviewed  BASIC METABOLIC PANEL - Abnormal; Notable for the following:       Result Value   Potassium 3.4 (*)    Anion gap 4 (*)    All other components within normal limits  CBC  TROPONIN I    EKG  EKG Interpretation  Date/Time:  Wednesday July 19 2016 19:59:33 EST Ventricular Rate:  66 PR Interval:  172 QRS Duration: 88 QT Interval:  370 QTC Calculation: 387 R Axis:   66 Text Interpretation:  Normal sinus rhythm Abnormal ECG Confirmed by Ethelda Chick  MD, Loral Campi [phone]) on 07/19/2016 8:03:22 PM       Radiology Dg Chest 2 View  Result Date: 07/19/2016 CLINICAL DATA:  Chest pain and shortness of breath EXAM: CHEST  2 VIEW COMPARISON:  None. FINDINGS: The lungs are well inflated. Cardiomediastinal contours are normal. No pneumothorax or pleural effusion. No focal airspace consolidation or pulmonary edema. Mild thoracic dextroscoliosis. IMPRESSION: Clear lungs. Electronically Signed   By: Deatra Robinson M.D.   On: 07/19/2016 20:47  Chest x-ray viewed by me Results for orders placed or performed during the  hospital encounter of 07/19/16  Basic metabolic panel  Result Value Ref Range   Sodium 139 135 - 145 mmol/L   Potassium 3.4 (L) 3.5 - 5.1 mmol/L   Chloride 108 101 - 111 mmol/L   CO2 27 22 - 32 mmol/L   Glucose, Bld 98 65 - 99 mg/dL   BUN 11 6 - 20 mg/dL   Creatinine, Ser 6.04 0.44 - 1.00 mg/dL   Calcium 9.0 8.9 - 54.0 mg/dL   GFR calc non Af Amer >60 >60 mL/min   GFR calc Af Amer >60 >60 mL/min   Anion gap 4 (L) 5 - 15  CBC  Result Value Ref Range   WBC 7.5 4.0 - 10.5 K/uL   RBC 4.11 3.87 - 5.11 MIL/uL   Hemoglobin 12.2 12.0 - 15.0 g/dL   HCT 98.1 19.1 - 47.8 %   MCV  90.5 78.0 - 100.0 fL   MCH 29.7 26.0 - 34.0 pg   MCHC 32.8 30.0 - 36.0 g/dL   RDW 16.1 09.6 - 04.5 %   Platelets 259 150 - 400 K/uL  Troponin I  Result Value Ref Range   Troponin I <0.03 <0.03 ng/mL   Dg Chest 2 View  Result Date: 07/19/2016 CLINICAL DATA:  Chest pain and shortness of breath EXAM: CHEST  2 VIEW COMPARISON:  None. FINDINGS: The lungs are well inflated. Cardiomediastinal contours are normal. No pneumothorax or pleural effusion. No focal airspace consolidation or pulmonary edema. Mild thoracic dextroscoliosis. IMPRESSION: Clear lungs. Electronically Signed   By: Deatra Robinson M.D.   On: 07/19/2016 20:47    Procedures Procedures (including critical care time)  Medications Ordered in ED Medications - No data to display   Initial Impression / Assessment and Plan / ED Course  I have reviewed the triage vital signs and the nursing notes.  Pertinent labs & imaging results that were available during my care of the patient were reviewed by me and considered in my medical decision making (see chart for details).     Exam and history is consistent with chest wall pain. Patient is perc negative heart score equals 0. She'll receive oral potassium supplementation prior to discharge. Ibuprofen for pain. She'll be referred to primary care physician  Final Clinical Impressions(s) / ED Diagnoses  Diagnosis  #1chest wall pain #2Hypokalemia Final diagnoses:  None    New Prescriptions New Prescriptions   No medications on file    I personally performed the services described in this documentation, which was scribed in my presence. The recorded information has been reviewed and considered.    Doug Sou, MD 07/19/16 [id number]    Doug Sou, MD 07/19/16 2238

## 2018-05-31 ENCOUNTER — Emergency Department (HOSPITAL_COMMUNITY)
Admission: EM | Admit: 2018-05-31 | Discharge: 2018-05-31 | Disposition: A | Discharge status: Home / Self Care | Payer: BC Managed Care – PPO | Attending: Emergency Medicine | Admitting: Emergency Medicine

## 2018-05-31 ENCOUNTER — Emergency Department (HOSPITAL_COMMUNITY): Payer: BC Managed Care – PPO

## 2018-05-31 ENCOUNTER — Encounter (HOSPITAL_COMMUNITY): Payer: Self-pay | Admitting: Emergency Medicine

## 2018-05-31 ENCOUNTER — Other Ambulatory Visit: Payer: Self-pay

## 2018-05-31 DIAGNOSIS — M25512 Pain in left shoulder: Secondary | ICD-10-CM | POA: Diagnosis not present

## 2018-05-31 DIAGNOSIS — Y929 Unspecified place or not applicable: Secondary | ICD-10-CM | POA: Insufficient documentation

## 2018-05-31 DIAGNOSIS — M25561 Pain in right knee: Secondary | ICD-10-CM | POA: Insufficient documentation

## 2018-05-31 DIAGNOSIS — R4182 Altered mental status, unspecified: Secondary | ICD-10-CM | POA: Diagnosis not present

## 2018-05-31 DIAGNOSIS — M25562 Pain in left knee: Secondary | ICD-10-CM | POA: Insufficient documentation

## 2018-05-31 DIAGNOSIS — S0081XA Abrasion of other part of head, initial encounter: Secondary | ICD-10-CM | POA: Diagnosis not present

## 2018-05-31 DIAGNOSIS — S0033XA Contusion of nose, initial encounter: Secondary | ICD-10-CM | POA: Insufficient documentation

## 2018-05-31 DIAGNOSIS — S60512A Abrasion of left hand, initial encounter: Secondary | ICD-10-CM | POA: Diagnosis not present

## 2018-05-31 DIAGNOSIS — Y999 Unspecified external cause status: Secondary | ICD-10-CM | POA: Insufficient documentation

## 2018-05-31 DIAGNOSIS — Z23 Encounter for immunization: Secondary | ICD-10-CM | POA: Diagnosis not present

## 2018-05-31 DIAGNOSIS — T07XXXA Unspecified multiple injuries, initial encounter: Secondary | ICD-10-CM

## 2018-05-31 DIAGNOSIS — Y939 Activity, unspecified: Secondary | ICD-10-CM | POA: Insufficient documentation

## 2018-05-31 HISTORY — DX: Other seasonal allergic rhinitis: J30.2

## 2018-05-31 MED ORDER — MELOXICAM 15 MG PO TABS: 15.0000 mg | ORAL_TABLET | Freq: Every day | ORAL | 0 refills | Status: AC

## 2018-05-31 MED ORDER — IBUPROFEN 800 MG PO TABS
800.0000 mg | ORAL_TABLET | Freq: Once | ORAL | Status: AC
Start: 2018-05-31 — End: 2018-05-31
  Administered 2018-05-31: 800 mg via ORAL
  Filled 2018-05-31: qty 1

## 2018-05-31 MED ORDER — TETANUS-DIPHTH-ACELL PERTUSSIS 5-2.5-18.5 LF-MCG/0.5 IM SUSP
0.5000 mL | Freq: Once | INTRAMUSCULAR | Status: AC
  Administered 2018-05-31: 0.5 mL via INTRAMUSCULAR
  Filled 2018-05-31: qty 0.5

## 2018-05-31 MED ORDER — CYCLOBENZAPRINE HCL 10 MG PO TABS: 5.0000 mg | ORAL_TABLET | Freq: Two times a day (BID) | ORAL | 0 refills | Status: AC | PRN

## 2018-05-31 NOTE — Discharge Instructions (Addendum)
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
## 2019-03-26 ENCOUNTER — Other Ambulatory Visit: Payer: Self-pay | Admitting: Sports Medicine

## 2019-03-26 DIAGNOSIS — R102 Pelvic and perineal pain: Secondary | ICD-10-CM

## 2019-04-12 ENCOUNTER — Ambulatory Visit
Admission: RE | Admit: 2019-04-12 | Discharge: 2019-04-12 | Disposition: A | Discharge status: Home / Self Care | Payer: BC Managed Care – PPO | Source: Ambulatory Visit | Attending: Sports Medicine | Admitting: Sports Medicine

## 2019-04-12 ENCOUNTER — Other Ambulatory Visit: Payer: Self-pay

## 2019-04-12 DIAGNOSIS — R102 Pelvic and perineal pain: Secondary | ICD-10-CM

## 2020-04-25 ENCOUNTER — Observation Stay (HOSPITAL_COMMUNITY)
Admission: EM | Admit: 2020-04-25 | Discharge: 2020-04-26 | Disposition: A | Discharge status: Home / Self Care | Payer: BC Managed Care – PPO | Attending: General Surgery | Admitting: General Surgery

## 2020-04-25 ENCOUNTER — Other Ambulatory Visit: Payer: Self-pay

## 2020-04-25 ENCOUNTER — Emergency Department (HOSPITAL_COMMUNITY): Payer: BC Managed Care – PPO

## 2020-04-25 ENCOUNTER — Encounter (HOSPITAL_COMMUNITY): Payer: Self-pay | Admitting: Emergency Medicine

## 2020-04-25 DIAGNOSIS — I10 Essential (primary) hypertension: Secondary | ICD-10-CM | POA: Diagnosis not present

## 2020-04-25 DIAGNOSIS — Z79899 Other long term (current) drug therapy: Secondary | ICD-10-CM | POA: Diagnosis not present

## 2020-04-25 DIAGNOSIS — R1013 Epigastric pain: Secondary | ICD-10-CM

## 2020-04-25 DIAGNOSIS — K810 Acute cholecystitis: Principal | ICD-10-CM | POA: Insufficient documentation

## 2020-04-25 DIAGNOSIS — Z20822 Contact with and (suspected) exposure to covid-19: Secondary | ICD-10-CM | POA: Diagnosis not present

## 2020-04-25 LAB — COMPREHENSIVE METABOLIC PANEL
ALT: 22 U/L (ref 0–44)
AST: 16 U/L (ref 15–41)
Albumin: 4.1 g/dL (ref 3.5–5.0)
Alkaline Phosphatase: 73 U/L (ref 38–126)
Anion gap: 11 (ref 5–15)
BUN: 9 mg/dL (ref 6–20)
CO2: 22 mmol/L (ref 22–32)
Calcium: 9.3 mg/dL (ref 8.9–10.3)
Chloride: 104 mmol/L (ref 98–111)
Creatinine, Ser: 0.78 mg/dL (ref 0.44–1.00)
GFR, Estimated: >60 mL/min (ref >60)
Glucose, Bld: 113 mg/dL — ABNORMAL HIGH (ref 70–99)
Potassium: 3.9 mmol/L (ref 3.5–5.1)
Sodium: 137 mmol/L (ref 135–145)
Total Bilirubin: 1.2 mg/dL (ref 0.3–1.2)
Total Protein: 7.7 g/dL (ref 6.5–8.1)

## 2020-04-25 LAB — URINALYSIS, ROUTINE W REFLEX MICROSCOPIC
Bacteria, UA: NONE SEEN
Bilirubin Urine: NEGATIVE
Glucose, UA: NEGATIVE mg/dL
Ketones, ur: 20 mg/dL — AB
Leukocytes,Ua: NEGATIVE
Nitrite: NEGATIVE
Protein, ur: NEGATIVE mg/dL
Specific Gravity, Urine: 1.026 (ref 1.005–1.030)
pH: 6 (ref 5.0–8.0)

## 2020-04-25 LAB — CBC
HCT: 46.1 % — ABNORMAL HIGH (ref 36.0–46.0)
Hemoglobin: 15 g/dL (ref 12.0–15.0)
MCH: 29.7 pg (ref 26.0–34.0)
MCHC: 32.5 g/dL (ref 30.0–36.0)
MCV: 91.3 fL (ref 80.0–100.0)
Platelets: 260 10*3/uL (ref 150–400)
RBC: 5.05 MIL/uL (ref 3.87–5.11)
RDW: 11.9 % (ref 11.5–15.5)
WBC: 8.6 10*3/uL (ref 4.0–10.5)
nRBC: 0 % (ref 0.0–0.2)

## 2020-04-25 LAB — I-STAT BETA HCG BLOOD, ED (MC, WL, AP ONLY): I-stat hCG, quantitative: <5 m[IU]/mL (ref <5)

## 2020-04-25 LAB — LIPASE, BLOOD: Lipase: 22 U/L (ref 11–51)

## 2020-04-25 MED ORDER — PIPERACILLIN-TAZOBACTAM 3.375 G IVPB 30 MIN
3.3750 g | Freq: Once | INTRAVENOUS | Status: AC
  Administered 2020-04-25: 3.375 g via INTRAVENOUS
  Filled 2020-04-25: qty 50

## 2020-04-25 MED ORDER — ONDANSETRON HCL 4 MG/2ML IJ SOLN: 4.0000 mg | Freq: Four times a day (QID) | INTRAMUSCULAR | Status: DC | PRN

## 2020-04-25 MED ORDER — FENTANYL CITRATE (PF) 100 MCG/2ML IJ SOLN
50.0000 ug | Freq: Once | INTRAMUSCULAR | Status: AC
  Administered 2020-04-25: 50 ug via INTRAVENOUS
  Filled 2020-04-25: qty 2

## 2020-04-25 MED ORDER — ACETAMINOPHEN 500 MG PO TABS
1000.0000 mg | ORAL_TABLET | Freq: Three times a day (TID) | ORAL | Status: DC
  Administered 2020-04-25: 1000 mg via ORAL
  Filled 2020-04-25 (×2): qty 2

## 2020-04-25 MED ORDER — FAMOTIDINE IN NACL 20-0.9 MG/50ML-% IV SOLN
20.0000 mg | Freq: Once | INTRAVENOUS | Status: AC
  Administered 2020-04-25: 20 mg via INTRAVENOUS
  Filled 2020-04-25: qty 50

## 2020-04-25 MED ORDER — ONDANSETRON 4 MG PO TBDP: 4.0000 mg | ORAL_TABLET | Freq: Four times a day (QID) | ORAL | Status: DC | PRN

## 2020-04-25 MED ORDER — OXYCODONE HCL 5 MG PO TABS
5.0000 mg | ORAL_TABLET | ORAL | Status: DC | PRN
  Administered 2020-04-26: 5 mg via ORAL

## 2020-04-25 MED ORDER — LACTATED RINGERS IV SOLN: INTRAVENOUS | Status: DC

## 2020-04-25 MED ORDER — MORPHINE SULFATE (PF) 2 MG/ML IV SOLN
1.0000 mg | INTRAVENOUS | Status: DC | PRN
  Administered 2020-04-26: 1 mg via INTRAVENOUS
  Filled 2020-04-25: qty 1

## 2020-04-25 MED ORDER — PIPERACILLIN-TAZOBACTAM 3.375 G IVPB
3.3750 g | Freq: Three times a day (TID) | INTRAVENOUS | Status: DC
  Administered 2020-04-26: 3.375 g via INTRAVENOUS
  Filled 2020-04-25: qty 50

## 2020-04-25 MED ORDER — ENOXAPARIN SODIUM 40 MG/0.4ML ~~LOC~~ SOLN: 40.0000 mg | SUBCUTANEOUS | Status: DC

## 2020-04-25 MED ORDER — ONDANSETRON HCL 4 MG/2ML IJ SOLN
4.0000 mg | Freq: Once | INTRAMUSCULAR | Status: AC
  Administered 2020-04-25: 4 mg via INTRAVENOUS
  Filled 2020-04-25: qty 2

## 2020-04-25 NOTE — H&P (Signed)
CC: Right upper quadrant abdominal pain   Requesting provider: Emergency Department  HPI: Sara Bell is an 34 y.o. female who presents with right upper quadrant abdominal pain. She notes her symptoms have been present for 2 days, but got acutely worse yesterday after a lunch of wings and buffalo dip. She reports pain that was initially present in her right upper quadrant which generalized to her epigastrium with some radiation to her back. She also notes some recent loose stools. She denies fevers or chills. Endorses nausea without vomiting. In the ED, her labs were not remarkable (no leukocytosis or hyperbilirubinemia), but her abdominal U/S was notable for a distended gallbladder with wall thickening, pericholecystic fluid, and a positive sonographic Murphy's sign. General surgery was consulted for evaluation and treatment recommendations.   Past Medical History:  Diagnosis Date  . Hx of chlamydia infection   . Hx of varicella   . Hypertension   . Seasonal allergies   . Seizures (HCC)     Past Surgical History:  Procedure Laterality Date  . KNEE ARTHROSCOPY WITH ANTERIOR CRUCIATE LIGAMENT (ACL) REPAIR     r/knee  . OTHER SURGICAL HISTORY Left    Implanon removed    Family History  Problem Relation Age of Onset  . Hypertension Mother   . Hypertension Father     Social:  reports that she has never smoked. She has never used smokeless tobacco. She reports that she does not drink alcohol and does not use drugs.  Allergies: No Known Allergies  Medications: I have reviewed the patient's current medications.   ROS - all of the below systems have been reviewed with the patient and positives are indicated with bold text General: chills, fever or night sweats Eyes: blurry vision or double vision ENT: epistaxis or sore throat Allergy/Immunology: itchy/watery eyes or nasal congestion Hematologic/Lymphatic: bleeding problems, blood clots or swollen lymph nodes Endocrine:  temperature intolerance or unexpected weight changes Breast: new or changing breast lumps or nipple discharge Resp: cough, shortness of breath, or wheezing CV: chest pain or dyspnea on exertion GI: as per HPI GU: dysuria, trouble voiding, or hematuria MSK: joint pain or joint stiffness Neuro: TIA or stroke symptoms Derm: pruritus and skin lesion changes Psych: anxiety and depression  PE Blood pressure (!) 154/80, pulse 70, temperature 98.2 F (36.8 C), temperature source Oral, resp. rate 16, height 5\' 6"  (1.676 m), weight 86.2 kg, last menstrual period 04/18/2020, SpO2 100 %, unknown if currently breastfeeding. Constitutional: NAD; conversant; no deformities Eyes: Moist conjunctiva; no lid lag; anicteric; PERRL Neck: Trachea midline; no thyromegaly Lungs: Normal respiratory effort; no tactile fremitus CV: RRR; no palpable thrills; no pitting edema GI: Abd soft, tenderness to deep palpation in epigastrium and right upper quadrant, positive clinical Murphy's sign, no rebound or guarding, non-distended; no palpable hepatosplenomegaly MSK: moves all extremities; no clubbing/cyanosis Psychiatric: Appropriate affect; alert and oriented x3 Lymphatic: No palpable cervical or axillary lymphadenopathy Skin: No rashes or lesions appreciated  Results for orders placed or performed during the hospital encounter of 04/25/20 (from the past 48 hour(s))  Lipase, blood     Status: None   Collection Time: 04/25/20  2:45 PM  Result Value Ref Range   Lipase 22 11 - 51 U/L    Comment: Performed at [facility], [address] [place], [place]  Comprehensive metabolic panel     Status: Abnormal   Collection Time: 04/25/20  2:45 PM  Result Value Ref Range   Sodium 137 135 -  145 mmol/L   Potassium 3.9 3.5 - 5.1 mmol/L   Chloride 104 98 - 111 mmol/L   CO2 22 22 - 32 mmol/L   Glucose, Bld 113 (H) 70 - 99 mg/dL    Comment: Glucose reference range applies only to samples taken after  fasting for at least 8 hours.   BUN 9 6 - 20 mg/dL   Creatinine, Ser 4.26 0.44 - 1.00 mg/dL   Calcium 9.3 8.9 - 83.4 mg/dL   Total Protein 7.7 6.5 - 8.1 g/dL   Albumin 4.1 3.5 - 5.0 g/dL   AST 16 15 - 41 U/L   ALT 22 0 - 44 U/L   Alkaline Phosphatase 73 38 - 126 U/L   Total Bilirubin 1.2 0.3 - 1.2 mg/dL   GFR, Estimated >19 >62 mL/min    Comment: (NOTE) Calculated using the CKD-EPI Creatinine Equation (2021)    Anion gap 11 5 - 15    Comment: Performed at [facility], [address] [place], [place] [address]  CBC     Status: Abnormal   Collection Time: 04/25/20  2:45 PM  Result Value Ref Range   WBC 8.6 4.0 - 10.5 K/uL   RBC 5.05 3.87 - 5.11 MIL/uL   Hemoglobin 15.0 12.0 - 15.0 g/dL   HCT 89.2 (H) 36 - 46 %   MCV 91.3 80.0 - 100.0 fL   MCH 29.7 26.0 - 34.0 pg   MCHC 32.5 30.0 - 36.0 g/dL   RDW 11.9 41.7 - 40.8 %   Platelets 260 150 - 400 K/uL   nRBC 0.0 0.0 - 0.2 %    Comment: Performed at [facility], [address] [place], [place] [address]  I-Stat beta hCG blood, ED     Status: None   Collection Time: 04/25/20  2:47 PM  Result Value Ref Range   I-stat hCG, quantitative <5.0 <5 mIU/mL   Comment 3            Comment:   GEST. AGE      CONC.  (mIU/mL)   <=1 WEEK        5 - 50     2 WEEKS       50 - 500     3 WEEKS       100 - 10,000     4 WEEKS     1,000 - 30,000        FEMALE AND NON-PREGNANT FEMALE:     LESS THAN 5 mIU/mL   Urinalysis, Routine w reflex microscopic Urine, Clean Catch     Status: Abnormal   Collection Time: 04/25/20  8:18 PM  Result Value Ref Range   Color, Urine YELLOW YELLOW   APPearance CLEAR CLEAR   Specific Gravity, Urine 1.026 1.005 - 1.030   pH 6.0 5.0 - 8.0   Glucose, UA NEGATIVE NEGATIVE mg/dL   Hgb urine dipstick SMALL (A) NEGATIVE   Bilirubin Urine NEGATIVE NEGATIVE   Ketones, ur 20 (A) NEGATIVE mg/dL   Protein, ur NEGATIVE NEGATIVE mg/dL   Nitrite NEGATIVE NEGATIVE   Leukocytes,Ua NEGATIVE NEGATIVE   RBC / HPF 0-5 0  - 5 RBC/hpf   WBC, UA 0-5 0 - 5 WBC/hpf   Bacteria, UA NONE SEEN NONE SEEN   Squamous Epithelial / LPF 0-5 0 - 5   Mucus PRESENT     Comment: Performed at [facility], [address], [place], [place] [address]    US Abdomen  Limited RUQ (LIVER/GB)  Result Date: 04/25/2020 CLINICAL DATA:  Nausea and vomiting EXAM: ULTRASOUND ABDOMEN LIMITED RIGHT UPPER QUADRANT COMPARISON:  None. FINDINGS: Gallbladder: There are gallstones. There is gallbladder wall thickening with the gallbladder wall measuring up to approximately 5 mm. The gallbladder is distended. There is pericholecystic free fluid. The sonographic Eulah Pont sign is positive. Common bile duct: Diameter: 6 mm Liver: No focal lesion identified. Within normal limits in parenchymal echogenicity. Portal vein is patent on color Doppler imaging with normal direction of blood flow towards the liver. Other: None. IMPRESSION: Findings are consistent with acute calculus cholecystitis in the appropriate clinical setting. Electronically Signed   By: Katherine Mantle M.D.   On: 04/25/2020 19:46    Imaging: As above   A/P: Sara Bell is an 34 y.o. female with acute cholecystitis.   - Admit to general surgery service - NPO, MIVF - Zosyn - Laparoscopic cholecystectomy tomorrow   Lannette Donath, MD General Surgery

## 2020-04-25 NOTE — ED Provider Notes (Signed)
[facility] EMERGENCY DEPARTMENT Provider Note   CSN: [id number] Arrival date & time: 04/25/20  1405     History Chief Complaint  Patient presents with  . Abdominal Pain    Sara Bell is a 34 y.o. female.  She said for about 2 weeks she is having loose bowel movements after every meal.  Starting yesterday after eating a spicy meal she has been having sharp upper central abdominal pain that wraps around to her back.  Has been nauseous no vomiting.  No fevers or chills.  She is concerned because her sister had her gallbladder out about her age.  No blood from above or below.  No urinary symptoms vaginal bleeding or discharge.  Does not drink alcohol or smoke, does not use NSAIDs regularly.   The history is provided by the patient.  Abdominal Pain Pain location:  Epigastric Pain quality: stabbing   Pain radiates to:  Back Pain severity:  Moderate Onset quality:  Gradual Duration:  2 days Timing:  Constant Progression:  Unchanged Chronicity:  New Context: not trauma   Relieved by:  None tried Worsened by:  Nothing Ineffective treatments:  None tried Associated symptoms: diarrhea and nausea   Associated symptoms: no chest pain, no cough, no dysuria, no fever, no hematemesis, no hematochezia, no hematuria, no shortness of breath, no sore throat, no vaginal bleeding, no vaginal discharge and no vomiting   Risk factors: no alcohol abuse, no NSAID use and not obese        Past Medical History:  Diagnosis Date  . Hx of chlamydia infection   . Hx of varicella   . Hypertension   . Seasonal allergies   . Seizures [facility])     Patient Active Problem List   Diagnosis Date Noted  . Eclampsia 01/29/2015  . Normal pregnancy, repeat 01/23/2015  . SVD (spontaneous vaginal delivery) 01/23/2015  . Gestational hypertension w/o significant proteinuria in 3rd trimester 01/23/2015    Past Surgical History:  Procedure Laterality Date  . KNEE ARTHROSCOPY WITH  ANTERIOR CRUCIATE LIGAMENT (ACL) REPAIR     r/knee  . OTHER SURGICAL HISTORY Left    Implanon removed     OB History    Gravida  2   Para  2   Term  2   Preterm      AB      Living  2     SAB      TAB      Ectopic      Multiple  0   Live Births  2           Family History  Problem Relation Age of Onset  . Hypertension Mother   . Hypertension Father     Social History   Tobacco Use  . Smoking status: Never Smoker  . Smokeless tobacco: Never Used  Substance Use Topics  . Alcohol use: No  . Drug use: No    Home Medications Prior to Admission medications   Medication Sig Start Date End Date Taking? Authorizing Provider  cyclobenzaprine (FLEXERIL) 10 MG tablet Take 0.5-1 tablets (5-10 mg total) by mouth 2 (two) times daily as needed for muscle spasms. 05/31/18   Arthor Captain, PA-C  labetalol (NORMODYNE) 300 MG tablet Take 1 tablet (300 mg total) by mouth 3 (three) times daily. Patient not taking: Reported on 07/19/2016 01/31/15   Huel Cote, MD  meloxicam (MOBIC) 15 MG tablet Take 1 tablet (15 mg total) by mouth daily. Take 1  daily with food. 05/31/18   Harris, Cammy Copa, PA-C  potassium chloride SA (K-DUR,KLOR-CON) 20 MEQ tablet Take 2 tablets (40 mEq total) by mouth 3 (three) times daily. Patient not taking: Reported on 07/19/2016 01/31/15   Huel Cote, MD  Prenatal Vit-Fe Fumarate-FA (PRENATAL MULTIVITAMIN) TABS tablet Take 1 tablet by mouth daily at 12 noon.    [provider]    Allergies    Patient has no known allergies.  Review of Systems   Review of Systems  Constitutional: Negative for fever.  HENT: Negative for sore throat.   Eyes: Negative for visual disturbance.  Respiratory: Negative for cough and shortness of breath.   Cardiovascular: Negative for chest pain.  Gastrointestinal: Positive for abdominal pain, diarrhea and nausea. Negative for hematemesis, hematochezia and vomiting.  Genitourinary: Negative for dysuria,  hematuria, vaginal bleeding and vaginal discharge.  Musculoskeletal: Positive for back pain.  Skin: Negative for rash.  Neurological: Negative for headaches.    Physical Exam Updated Vital Signs BP (!) 155/90 (BP Location: Left Arm)   Pulse (!) 58   Temp 98.2 F (36.8 C) (Oral)   Resp 14   Ht 5\' 6"  (1.676 m)   Wt 86.2 kg   LMP 04/18/2020   SpO2 98%   BMI 30.67 kg/m   Physical Exam Vitals and nursing note reviewed.  Constitutional:      General: She is not in acute distress.    Appearance: Normal appearance. She is well-developed.  HENT:     Head: Normocephalic and atraumatic.  Eyes:     Conjunctiva/sclera: Conjunctivae normal.  Cardiovascular:     Rate and Rhythm: Normal rate and regular rhythm.     Heart sounds: No murmur heard.   Pulmonary:     Effort: Pulmonary effort is normal. No respiratory distress.     Breath sounds: Normal breath sounds.  Abdominal:     Palpations: Abdomen is soft.     Tenderness: There is abdominal tenderness (subxiphoid).  Musculoskeletal:        General: No deformity or signs of injury. Normal range of motion.     Cervical back: Neck supple.  Skin:    General: Skin is warm and dry.     Capillary Refill: Capillary refill takes less than 2 seconds.  Neurological:     General: No focal deficit present.     Mental Status: She is alert.     ED Results / Procedures / Treatments   Labs (all labs ordered are listed, but only abnormal results are displayed) Labs Reviewed  COMPREHENSIVE METABOLIC PANEL - Abnormal; Notable for the following components:      Result Value   Glucose, Bld 113 (*)    All other components within normal limits  CBC - Abnormal; Notable for the following components:   HCT 46.1 (*)    All other components within normal limits  URINALYSIS, ROUTINE W REFLEX MICROSCOPIC - Abnormal; Notable for the following components:   Hgb urine dipstick SMALL (*)    Ketones, ur 20 (*)    All other components within normal  limits  COMPREHENSIVE METABOLIC PANEL - Abnormal; Notable for the following components:   Potassium 3.4 (*)    Glucose, Bld 115 (*)    AST 13 (*)    Total Bilirubin 1.8 (*)    All other components within normal limits  RESPIRATORY PANEL BY RT PCR (FLU A&B, COVID)  LIPASE, BLOOD  HIV ANTIBODY (ROUTINE TESTING W REFLEX)  MAGNESIUM  CBC  I-STAT BETA HCG  BLOOD, ED ([facility], [facility], [facility] ONLY)  SURGICAL PATHOLOGY    EKG EKG Interpretation  Date/Time:  Sunday April 25 2020 14:34:02 EST Ventricular Rate:  58 PR Interval:  184 QRS Duration: 90 QT Interval:  384 QTC Calculation: 376 R Axis:   71 Text Interpretation: Sinus bradycardia with sinus arrhythmia Otherwise normal ECG No significant change since prior 2/18 Confirmed by Meridee ScoreButler, Brandilynn Taormina [phone]([id number]) on 04/25/2020 6:13:52 PM   Radiology US Abdomen Limited RUQ (LIVER/GB)  Result Date: 04/25/2020 CLINICAL DATA:  Nausea and vomiting EXAM: ULTRASOUND ABDOMEN LIMITED RIGHT UPPER QUADRANT COMPARISON:  None. FINDINGS: Gallbladder: There are gallstones. There is gallbladder wall thickening with the gallbladder wall measuring up to approximately 5 mm. The gallbladder is distended. There is pericholecystic free fluid. The sonographic Eulah PontMurphy sign is positive. Common bile duct: Diameter: 6 mm Liver: No focal lesion identified. Within normal limits in parenchymal echogenicity. Portal vein is patent on color Doppler imaging with normal direction of blood flow towards the liver. Other: None. IMPRESSION: Findings are consistent with acute calculus cholecystitis in the appropriate clinical setting. Electronically Signed   By: Katherine Mantlehristopher  Green M.D.   On: 04/25/2020 19:46    Procedures Procedures (including critical care time)  Medications Ordered in ED Medications  enoxaparin (LOVENOX) injection 40 mg (has no administration in time range)  piperacillin-tazobactam (ZOSYN) IVPB 3.375 g (0 g Intravenous Stopped 04/26/20 0815)  acetaminophen (TYLENOL) tablet  1,000 mg (1,000 mg Oral Not Given 04/26/20 0608)  oxyCODONE (Oxy IR/ROXICODONE) immediate release tablet 5 mg (has no administration in time range)  ondansetron (ZOFRAN-ODT) disintegrating tablet 4 mg (has no administration in time range)    Or  ondansetron (ZOFRAN) injection 4 mg (has no administration in time range)  lactated ringers infusion (0 mLs Intravenous Stopped 04/26/20 1030)  morphine 2 MG/ML injection 1-2 mg ( Intravenous MAR Hold 04/26/20 0816)  lactated ringers infusion ( Intravenous Restarted 04/26/20 1012)  HYDROmorphone (DILAUDID) injection 0.25-0.5 mg (has no administration in time range)  ondansetron (ZOFRAN) injection 4 mg (4 mg Intravenous Given 04/25/20 2027)  famotidine (PEPCID) IVPB 20 mg premix (0 mg Intravenous Stopped 04/25/20 2120)  fentaNYL (SUBLIMAZE) injection 50 mcg (50 mcg Intravenous Given 04/25/20 2029)  piperacillin-tazobactam (ZOSYN) IVPB 3.375 g (0 g Intravenous Stopped 04/25/20 2315)  chlorhexidine (PERIDEX) 0.12 % solution 15 mL (15 mLs Mouth/Throat Given 04/26/20 [id number])    ED Course  I have reviewed the triage vital signs and the nursing notes.  Pertinent labs & imaging results that were available during my care of the patient were reviewed by me and considered in my medical decision making (see chart for details).  Clinical Course as of Apr 26 1041  Wynelle LinkSun Apr 25, 2020  2045 Discussed with general surgery who is going to come down take a look of the patient.   [MB]  2[facility] Surgery is going to be admitting the patient.  They asked by start her on some Zosyn.   [MB]    Clinical Course User Index [MB] Terrilee FilesButler, Adler Alton C, MD   MDM Rules/Calculators/A&P                         This patient complains of mid epigastric abdominal pain radiating through to the back along with nausea; this involves an extensive number of treatment Options and is a complaint that carries with it a high risk of complications and Morbidity. The differential includes gastritis,  peptic ulcer disease, cholelithiasis, cholecystitis  I ordered, reviewed and interpreted  labs, which included CBC with normal white count normal hemoglobin, chemistries normal other than mildly low potassium, slightly elevated bilirubin, Covid testing negative, urinalysis unremarkable I ordered medication IV fluids nausea and pain medicine I ordered imaging studies which included right upper quadrant ultrasound and I independently    visualized and interpreted imaging which showed gallstones gallbladder thickening pericholecystic fluid Previous records obtained and reviewed in epic, no recent findings I consulted general surgery Dr. Tawana Scale PA and discussed lab and imaging findings  Critical Interventions: None  After the interventions stated above, I reevaluated the patient and found patient's pain to be controlled.  General surgery contacted me back and said they are admitting the patient to their service.   Final Clinical Impression(s) / ED Diagnoses Final diagnoses:  Epigastric pain  Acute cholecystitis    Rx / DC Orders ED Discharge Orders    None       Terrilee Files, MD 04/26/20 1046

## 2020-04-25 NOTE — ED Triage Notes (Signed)
C/o upper abd pain x 2 days with nausea.  Denies vomiting, diarrhea, and urinary complaints.

## 2020-04-25 NOTE — ED Notes (Signed)
Pt taken to US

## 2020-04-26 ENCOUNTER — Encounter (HOSPITAL_COMMUNITY): Payer: Self-pay | Admitting: General Surgery

## 2020-04-26 ENCOUNTER — Observation Stay (HOSPITAL_COMMUNITY): Payer: BC Managed Care – PPO | Admitting: Certified Registered Nurse Anesthetist

## 2020-04-26 ENCOUNTER — Encounter (HOSPITAL_COMMUNITY)
Admission: EM | Disposition: A | Discharge status: Home / Self Care | Payer: Self-pay | Source: Home / Self Care | Attending: Emergency Medicine

## 2020-04-26 ENCOUNTER — Encounter (HOSPITAL_COMMUNITY): Payer: Self-pay | Admitting: *Deleted

## 2020-04-26 HISTORY — PX: CHOLECYSTECTOMY: SHX55

## 2020-04-26 LAB — HIV ANTIBODY (ROUTINE TESTING W REFLEX): HIV Screen 4th Generation wRfx: NONREACTIVE

## 2020-04-26 LAB — COMPREHENSIVE METABOLIC PANEL
ALT: 19 U/L (ref 0–44)
AST: 13 U/L — ABNORMAL LOW (ref 15–41)
Albumin: 3.7 g/dL (ref 3.5–5.0)
Alkaline Phosphatase: 63 U/L (ref 38–126)
Anion gap: 9 (ref 5–15)
BUN: 6 mg/dL (ref 6–20)
CO2: 22 mmol/L (ref 22–32)
Calcium: 9.1 mg/dL (ref 8.9–10.3)
Chloride: 104 mmol/L (ref 98–111)
Creatinine, Ser: 0.69 mg/dL (ref 0.44–1.00)
GFR, Estimated: >60 mL/min (ref >60)
Glucose, Bld: 115 mg/dL — ABNORMAL HIGH (ref 70–99)
Potassium: 3.4 mmol/L — ABNORMAL LOW (ref 3.5–5.1)
Sodium: 135 mmol/L (ref 135–145)
Total Bilirubin: 1.8 mg/dL — ABNORMAL HIGH (ref 0.3–1.2)
Total Protein: 7.2 g/dL (ref 6.5–8.1)

## 2020-04-26 LAB — CBC
HCT: 43.5 % (ref 36.0–46.0)
Hemoglobin: 14.2 g/dL (ref 12.0–15.0)
MCH: 29.6 pg (ref 26.0–34.0)
MCHC: 32.6 g/dL (ref 30.0–36.0)
MCV: 90.6 fL (ref 80.0–100.0)
Platelets: 241 10*3/uL (ref 150–400)
RBC: 4.8 MIL/uL (ref 3.87–5.11)
RDW: 11.9 % (ref 11.5–15.5)
WBC: 9.2 10*3/uL (ref 4.0–10.5)
nRBC: 0 % (ref 0.0–0.2)

## 2020-04-26 LAB — RESPIRATORY PANEL BY RT PCR (FLU A&B, COVID)
Influenza A by PCR: NEGATIVE
Influenza B by PCR: NEGATIVE
SARS Coronavirus 2 by RT PCR: NEGATIVE

## 2020-04-26 LAB — MAGNESIUM: Magnesium: 1.8 mg/dL (ref 1.7–2.4)

## 2020-04-26 SURGERY — LAPAROSCOPIC CHOLECYSTECTOMY WITH INTRAOPERATIVE CHOLANGIOGRAM
Anesthesia: General | Site: Abdomen

## 2020-04-26 MED ORDER — ROCURONIUM BROMIDE 10 MG/ML (PF) SYRINGE
PREFILLED_SYRINGE | INTRAVENOUS | Status: AC
  Filled 2020-04-26: qty 10

## 2020-04-26 MED ORDER — PHENYLEPHRINE 40 MCG/ML (10ML) SYRINGE FOR IV PUSH (FOR BLOOD PRESSURE SUPPORT)
PREFILLED_SYRINGE | INTRAVENOUS | Status: DC | PRN
  Administered 2020-04-26: 120 ug via INTRAVENOUS
  Administered 2020-04-26: 80 ug via INTRAVENOUS

## 2020-04-26 MED ORDER — MIDAZOLAM HCL 5 MG/5ML IJ SOLN
INTRAMUSCULAR | Status: DC | PRN
  Administered 2020-04-26: 2 mg via INTRAVENOUS

## 2020-04-26 MED ORDER — TRAMADOL HCL 50 MG PO TABS
50.0000 mg | ORAL_TABLET | Freq: Four times a day (QID) | ORAL | 0 refills | Status: AC | PRN
Start: 2020-04-26 — End: 2021-04-26

## 2020-04-26 MED ORDER — PHENYLEPHRINE 40 MCG/ML (10ML) SYRINGE FOR IV PUSH (FOR BLOOD PRESSURE SUPPORT)
PREFILLED_SYRINGE | INTRAVENOUS | Status: AC
  Filled 2020-04-26: qty 20

## 2020-04-26 MED ORDER — PROPOFOL 10 MG/ML IV BOLUS
INTRAVENOUS | Status: AC
  Filled 2020-04-26: qty 20

## 2020-04-26 MED ORDER — LIDOCAINE 2% (20 MG/ML) 5 ML SYRINGE
INTRAMUSCULAR | Status: DC | PRN
  Administered 2020-04-26: 80 mg via INTRAVENOUS

## 2020-04-26 MED ORDER — DEXAMETHASONE SODIUM PHOSPHATE 10 MG/ML IJ SOLN
INTRAMUSCULAR | Status: DC | PRN
  Administered 2020-04-26: 10 mg via INTRAVENOUS

## 2020-04-26 MED ORDER — PROPOFOL 10 MG/ML IV BOLUS
INTRAVENOUS | Status: DC | PRN
  Administered 2020-04-26: 150 mg via INTRAVENOUS

## 2020-04-26 MED ORDER — KETOROLAC TROMETHAMINE 30 MG/ML IJ SOLN
INTRAMUSCULAR | Status: AC
  Filled 2020-04-26: qty 1

## 2020-04-26 MED ORDER — CHLORHEXIDINE GLUCONATE 0.12 % MT SOLN
OROMUCOSAL | Status: AC
  Administered 2020-04-26: 15 mL via OROMUCOSAL
  Filled 2020-04-26: qty 15

## 2020-04-26 MED ORDER — DEXAMETHASONE SODIUM PHOSPHATE 10 MG/ML IJ SOLN
INTRAMUSCULAR | Status: AC
  Filled 2020-04-26: qty 1

## 2020-04-26 MED ORDER — FENTANYL CITRATE (PF) 250 MCG/5ML IJ SOLN
INTRAMUSCULAR | Status: AC
  Filled 2020-04-26: qty 5

## 2020-04-26 MED ORDER — KETOROLAC TROMETHAMINE 30 MG/ML IJ SOLN
INTRAMUSCULAR | Status: DC | PRN
  Administered 2020-04-26: 30 mg via INTRAVENOUS

## 2020-04-26 MED ORDER — SODIUM CHLORIDE 0.9 % IR SOLN
Status: DC | PRN
  Administered 2020-04-26: 1

## 2020-04-26 MED ORDER — ONDANSETRON HCL 4 MG/2ML IJ SOLN
INTRAMUSCULAR | Status: DC | PRN
  Administered 2020-04-26: 4 mg via INTRAVENOUS

## 2020-04-26 MED ORDER — 0.9 % SODIUM CHLORIDE (POUR BTL) OPTIME
TOPICAL | Status: DC | PRN
  Administered 2020-04-26: 1000 mL

## 2020-04-26 MED ORDER — SUGAMMADEX SODIUM 200 MG/2ML IV SOLN
INTRAVENOUS | Status: DC | PRN
  Administered 2020-04-26: 200 mg via INTRAVENOUS

## 2020-04-26 MED ORDER — ONDANSETRON HCL 4 MG/2ML IJ SOLN
INTRAMUSCULAR | Status: AC
  Filled 2020-04-26: qty 2

## 2020-04-26 MED ORDER — LACTATED RINGERS IV SOLN: INTRAVENOUS | Status: DC

## 2020-04-26 MED ORDER — OXYCODONE HCL 5 MG PO TABS
ORAL_TABLET | ORAL | Status: AC
  Filled 2020-04-26: qty 1

## 2020-04-26 MED ORDER — MIDAZOLAM HCL 2 MG/2ML IJ SOLN
INTRAMUSCULAR | Status: AC
  Filled 2020-04-26: qty 2

## 2020-04-26 MED ORDER — HYDROMORPHONE HCL 1 MG/ML IJ SOLN
INTRAMUSCULAR | Status: AC
  Filled 2020-04-26: qty 1

## 2020-04-26 MED ORDER — BUPIVACAINE HCL (PF) 0.25 % IJ SOLN
INTRAMUSCULAR | Status: AC
  Filled 2020-04-26: qty 30

## 2020-04-26 MED ORDER — ROCURONIUM BROMIDE 10 MG/ML (PF) SYRINGE
PREFILLED_SYRINGE | INTRAVENOUS | Status: DC | PRN
  Administered 2020-04-26: 70 mg via INTRAVENOUS

## 2020-04-26 MED ORDER — BUPIVACAINE HCL 0.25 % IJ SOLN
INTRAMUSCULAR | Status: DC | PRN
  Administered 2020-04-26: 20 mL

## 2020-04-26 MED ORDER — SUGAMMADEX SODIUM 500 MG/5ML IV SOLN
INTRAVENOUS | Status: AC
  Filled 2020-04-26: qty 5

## 2020-04-26 MED ORDER — CHLORHEXIDINE GLUCONATE 0.12 % MT SOLN: 15.0000 mL | Freq: Once | OROMUCOSAL | Status: AC

## 2020-04-26 MED ORDER — HYDROMORPHONE HCL 1 MG/ML IJ SOLN
0.2500 mg | INTRAMUSCULAR | Status: DC | PRN
  Administered 2020-04-26: 0.5 mg via INTRAVENOUS

## 2020-04-26 MED ORDER — LIDOCAINE 2% (20 MG/ML) 5 ML SYRINGE
INTRAMUSCULAR | Status: AC
  Filled 2020-04-26: qty 5

## 2020-04-26 MED ORDER — FENTANYL CITRATE (PF) 100 MCG/2ML IJ SOLN
INTRAMUSCULAR | Status: DC | PRN
  Administered 2020-04-26 (×2): 50 ug via INTRAVENOUS
  Administered 2020-04-26 (×2): 25 ug via INTRAVENOUS
  Administered 2020-04-26: 50 ug via INTRAVENOUS

## 2020-04-26 SURGICAL SUPPLY — 44 items
APPLIER CLIP 5 13 M/L LIGAMAX5 (MISCELLANEOUS)
CANISTER SUCT 3000ML PPV (MISCELLANEOUS) ×3 IMPLANT
CHLORAPREP W/TINT 26 (MISCELLANEOUS) ×3 IMPLANT
CLIP APPLIE 5 13 M/L LIGAMAX5 (MISCELLANEOUS) IMPLANT
CLIP VESOLOCK MED LG 6/CT (CLIP) ×3 IMPLANT
COVER MAYO STAND STRL (DRAPES) ×3 IMPLANT
COVER SURGICAL LIGHT HANDLE (MISCELLANEOUS) ×3 IMPLANT
COVER TRANSDUCER ULTRASND (DRAPES) ×3 IMPLANT
COVER WAND RF STERILE (DRAPES) ×3 IMPLANT
DERMABOND ADVANCED (GAUZE/BANDAGES/DRESSINGS) ×2
DERMABOND ADVANCED .7 DNX12 (GAUZE/BANDAGES/DRESSINGS) ×1 IMPLANT
DRAPE C-ARM 42X120 X-RAY (DRAPES) ×3 IMPLANT
ELECT REM PT RETURN 9FT ADLT (ELECTROSURGICAL) ×3
ELECTRODE REM PT RTRN 9FT ADLT (ELECTROSURGICAL) ×1 IMPLANT
GLOVE BIO SURGEON STRL SZ7.5 (GLOVE) ×3 IMPLANT
GOWN STRL REUS W/ TWL LRG LVL3 (GOWN DISPOSABLE) ×2 IMPLANT
GOWN STRL REUS W/ TWL XL LVL3 (GOWN DISPOSABLE) ×1 IMPLANT
GOWN STRL REUS W/TWL LRG LVL3 (GOWN DISPOSABLE) ×4
GOWN STRL REUS W/TWL XL LVL3 (GOWN DISPOSABLE) ×2
GRASPER SUT TROCAR 14GX15 (MISCELLANEOUS) ×3 IMPLANT
IV CATH 14GX2 1/4 (CATHETERS) ×3 IMPLANT
KIT BASIN OR (CUSTOM PROCEDURE TRAY) ×3 IMPLANT
KIT TURNOVER KIT B (KITS) ×3 IMPLANT
NEEDLE INSUFFLATION 14GA 120MM (NEEDLE) ×3 IMPLANT
NS IRRIG 1000ML POUR BTL (IV SOLUTION) ×3 IMPLANT
PAD ARMBOARD 7.5X6 YLW CONV (MISCELLANEOUS) ×6 IMPLANT
POUCH LAPAROSCOPIC INSTRUMENT (MISCELLANEOUS) ×3 IMPLANT
POUCH RETRIEVAL ECOSAC 10 (ENDOMECHANICALS) IMPLANT
POUCH RETRIEVAL ECOSAC 10MM (ENDOMECHANICALS)
SCISSORS LAP 5X35 DISP (ENDOMECHANICALS) ×3 IMPLANT
SET CHOLANGIOGRAPHY FRANKLIN (SET/KITS/TRAYS/PACK) ×3 IMPLANT
SET IRRIG TUBING LAPAROSCOPIC (IRRIGATION / IRRIGATOR) ×3 IMPLANT
SET TUBE SMOKE EVAC HIGH FLOW (TUBING) ×3 IMPLANT
SLEEVE ENDOPATH XCEL 5M (ENDOMECHANICALS) ×3 IMPLANT
SPECIMEN JAR SMALL (MISCELLANEOUS) ×3 IMPLANT
STOPCOCK 4 WAY LG BORE MALE ST (IV SETS) ×3 IMPLANT
SUT MNCRL AB 4-0 PS2 18 (SUTURE) ×3 IMPLANT
TOWEL GREEN STERILE (TOWEL DISPOSABLE) ×3 IMPLANT
TOWEL GREEN STERILE FF (TOWEL DISPOSABLE) ×3 IMPLANT
TRAY LAPAROSCOPIC MC (CUSTOM PROCEDURE TRAY) ×3 IMPLANT
TROCAR XCEL NON-BLD 11X100MML (ENDOMECHANICALS) ×3 IMPLANT
TROCAR XCEL NON-BLD 5MMX100MML (ENDOMECHANICALS) ×3 IMPLANT
WARMER LAPAROSCOPE (MISCELLANEOUS) ×3 IMPLANT
WATER STERILE IRR 1000ML POUR (IV SOLUTION) ×3 IMPLANT

## 2020-04-26 NOTE — Anesthesia Preprocedure Evaluation (Addendum)
Anesthesia Evaluation  Patient identified by MRN, date of birth, ID band Patient awake    Reviewed: Allergy & Precautions, H&P , NPO status , Patient's Chart, lab work & pertinent test results, reviewed documented beta blocker date and time   Airway Mallampati: II  TM Distance: >3 FB Neck ROM: Full    Dental no notable dental hx. (+) Teeth Intact, Dental Advisory Given   Pulmonary neg pulmonary ROS,    Pulmonary exam normal breath sounds clear to auscultation       Cardiovascular hypertension, Pt. on medications and Pt. on home beta blockers  Rhythm:Regular Rate:Normal     Neuro/Psych negative neurological ROS  negative psych ROS   GI/Hepatic negative GI ROS, Neg liver ROS,   Endo/Other  negative endocrine ROS  Renal/GU negative Renal ROS  negative genitourinary   Musculoskeletal   Abdominal   Peds  Hematology negative hematology ROS (+)   Anesthesia Other Findings   Reproductive/Obstetrics negative OB ROS                            Anesthesia Physical Anesthesia Plan  ASA: II  Anesthesia Plan: General   Post-op Pain Management:    Induction: Intravenous  PONV Risk Score and Plan: 4 or greater and Ondansetron, Dexamethasone and Midazolam  Airway Management Planned: Oral ETT  Additional Equipment:   Intra-op Plan:   Post-operative Plan: Extubation in OR  Informed Consent: I have reviewed the patients History and Physical, chart, labs and discussed the procedure including the risks, benefits and alternatives for the proposed anesthesia with the patient or authorized representative who has indicated his/her understanding and acceptance.     Dental advisory given  Plan Discussed with: CRNA  Anesthesia Plan Comments:         Anesthesia Quick Evaluation

## 2020-04-26 NOTE — Transfer of Care (Signed)
Immediate Anesthesia Transfer of Care Note  Patient: Sara Bell  Procedure(s) Performed: LAPAROSCOPIC CHOLECYSTECTOMY WITH INTRAOPERATIVE CHOLANGIOGRAM (N/A Abdomen)  Patient Location: PACU  Anesthesia Type:General  Level of Consciousness: drowsy  Airway & Oxygen Therapy: Patient Spontanous Breathing and Patient connected to face mask oxygen  Post-op Assessment: Report given to RN and Post -op Vital signs reviewed and stable  Post vital signs: Reviewed and stable  Last Vitals:  Vitals Value Taken Time  BP 152/95 04/26/20 1028  Temp 36.8 C 04/26/20 1028  Pulse 73 04/26/20 1030  Resp 18 04/26/20 1030  SpO2 100 % 04/26/20 1030  Vitals shown include unvalidated device data.  Last Pain:  Vitals:   04/26/20 0719  TempSrc:   PainSc: 0-No pain         Complications: No complications documented.

## 2020-04-26 NOTE — Anesthesia Procedure Notes (Signed)
Procedure Name: Intubation Date/Time: 04/26/2020 9:27 AM Performed by: Candis Shine, CRNA Pre-anesthesia Checklist: Patient identified, Emergency Drugs available, Suction available and Patient being monitored Patient Re-evaluated:Patient Re-evaluated prior to induction Oxygen Delivery Method: Circle System Utilized Preoxygenation: Pre-oxygenation with 100% oxygen Induction Type: IV induction Ventilation: Mask ventilation without difficulty Laryngoscope Size: Mac and 3 Grade View: Grade I Tube type: Oral Tube size: 7.0 mm Number of attempts: 1 Airway Equipment and Method: Stylet Placement Confirmation: ETT inserted through vocal cords under direct vision,  positive ETCO2 and breath sounds checked- equal and bilateral Secured at: 22 cm Tube secured with: Tape Dental Injury: Teeth and Oropharynx as per pre-operative assessment

## 2020-04-26 NOTE — ED Notes (Signed)
Patient ambulated to restroom.

## 2020-04-26 NOTE — Discharge Instructions (Signed)
[facility] ______[facility] °LAPAROSCOPIC SURGERY: POST OP INSTRUCTIONS °Always review your discharge instruction sheet given to you by the facility where your surgery was performed. °IF YOU HAVE DISABILITY OR FAMILY LEAVE FORMS, YOU MUST BRING THEM TO THE OFFICE FOR PROCESSING.   °DO NOT GIVE THEM TO YOUR DOCTOR. ° °1. A prescription for pain medication may be given to you upon discharge.  Take your pain medication as prescribed, if needed.  If narcotic pain medicine is not needed, then you may take acetaminophen (Tylenol) or ibuprofen (Advil) as needed. °2. Take your usually prescribed medications unless otherwise directed. °3. If you need a refill on your pain medication, please contact your pharmacy.  They will contact our office to request authorization. Prescriptions will not be filled after 5pm or on week-ends. °4. You should follow a light diet the first few days after arrival home, such as soup and crackers, etc.  Be sure to include lots of fluids daily. °5. Most patients will experience some swelling and bruising in the area of the incisions.  Ice packs will help.  Swelling and bruising can take several days to resolve.  °6. It is common to experience some constipation if taking pain medication after surgery.  Increasing fluid intake and taking a stool softener (such as Colace) will usually help or prevent this problem from occurring.  A mild laxative (Milk of Magnesia or Miralax) should be taken according to package instructions if there are no bowel movements after 48 hours. °7. Unless discharge instructions indicate otherwise, you may remove your bandages 24-48 hours after surgery, and you may shower at that time.  You may have steri-strips (small skin tapes) in place directly over the incision.  These strips should be left on the skin for 7-10 days.  If your surgeon used skin glue on the incision, you may shower in 24 hours.  The glue will flake off over the next 2-3 weeks.  Any sutures or  staples will be removed at the office during your follow-up visit. °8. ACTIVITIES:  You may resume regular (light) daily activities beginning the next day--such as daily self-care, walking, climbing stairs--gradually increasing activities as tolerated.  You may have sexual intercourse when it is comfortable.  Refrain from any heavy lifting or straining until approved by your doctor. °a. You may drive when you are no longer taking prescription pain medication, you can comfortably wear a seatbelt, and you can safely maneuver your car and apply brakes. °b. RETURN TO WORK:  __________________________________________________________ °9. You should see your doctor in the office for a follow-up appointment approximately 2-3 weeks after your surgery.  Make sure that you call for this appointment within a day or two after you arrive home to insure a convenient appointment time. °10. OTHER INSTRUCTIONS: __________________________________________________________________________________________________________________________ __________________________________________________________________________________________________________________________ °WHEN TO CALL YOUR DOCTOR: °1. Fever over 101.0 °2. Inability to urinate °3. Continued bleeding from incision. °4. Increased pain, redness, or drainage from the incision. °5. Increasing abdominal pain ° °The clinic staff is available to answer your questions during regular business hours.  Please don’t hesitate to call and ask to speak to one of the nurses for clinical concerns.  If you have a medical emergency, go to the nearest emergency room or call 911.  A surgeon from [facility] is always on call at the hospital. °[address], [address], [place], [place]  [address] ? [address] [address], [place],    [address] °[phone] ? [phone] ? FAX [phone] °Web site:   [email or web address] °

## 2020-04-26 NOTE — Op Note (Signed)
04/26/2020  10:12 AM  PATIENT:  Sara Bell  34 y.o. female  PRE-OPERATIVE DIAGNOSIS:  ACUTE CHOLECYSTITIS  POST-OPERATIVE DIAGNOSIS:  ACUTE CHOLECYSTITIS  PROCEDURE:  Procedure(s): LAPAROSCOPIC CHOLECYSTECTOMY  SURGEON:  Surgeon(s) and Role:    Axel Filler, MD - Primary  ANESTHESIA:   local and general  EBL:  minimal   BLOOD ADMINISTERED:none  DRAINS: none   LOCAL MEDICATIONS USED:  BUPIVICAINE   SPECIMEN:  Source of Specimen:  gallbladder  DISPOSITION OF SPECIMEN:  PATHOLOGY  COUNTS:  YES  TOURNIQUET:  * No tourniquets in log *  DICTATION: .Dragon Dictation  EBL: <5cc   Complications: none   Counts: reported as correct x 2   Findings:acute inflamed gallbladder, gallstones  Indications for procedure: Pt is a 84F with RUQ pain and seen to have gallstones and acute cholecystitis.   Details of the procedure: The patient was taken to the operating and placed in the supine position with bilateral SCDs in place. A time out was called and all facts were verified. A pneumoperitoneum was obtained via A Veress needle technique to a pressure of 17mm of mercury. A 46mm trochar was then placed in the right upper quadrant under visualization, and there were no injuries to any abdominal organs. A 11 mm port was then placed in the umbilical region after infiltrating with local anesthesia under direct visualization. A second epigastric port was placed under direct visualization.   The gallbladder was identified and retracted, the peritoneum was then sharply dissected from the gallbladder and this dissection was carried down to Calot's triangle. The cystic duct was identified and dissected circumferentially and seen going into the gallbladder 360.  The cystic artery was dissected away from the surrounding tissues.   The critical angle was obtained.   2 clips were placed proximally one distally and the cystic duct transected. The cystic artery was identified and 2 clips  placed proximally and one distally and transected. We then proceeded to remove the gallbladder off the hepatic fossa with Bovie cautery. A retrieval bag was then placed in the abdomen and gallbladder placed in the bag. The hepatic fossa was then reexamined and hemostasis was achieved with Bovie cautery and was excellent at this portion of the case. The subhepatic fossa and perihepatic fossa was then irrigated until the effluent was clear. The specimen bag and specimen were removed from the abdominal cavity.  The 11 mm trocar fascia was reapproximated with the Endo Close #1 Vicryl x2. The pneumoperitoneum was evacuated and all trochars removed under direct visulalization. The skin was then closed with 4-0 Monocryl and the skin dressed with Dermabond. The patient was awaken from general anesthesia and taken to the recovery room in stable condition.    PLAN OF CARE: Discharge to home after PACU  PATIENT DISPOSITION:  PACU - hemodynamically stable.   Delay start of Pharmacological VTE agent (>24hrs) due to surgical blood loss or risk of bleeding: not applicable

## 2020-04-27 ENCOUNTER — Encounter (HOSPITAL_COMMUNITY): Payer: Self-pay | Admitting: General Surgery

## 2020-04-27 LAB — SURGICAL PATHOLOGY

## 2020-04-27 NOTE — Anesthesia Postprocedure Evaluation (Signed)
Anesthesia Post Note  Patient: Sara Bell  Procedure(s) Performed: LAPAROSCOPIC CHOLECYSTECTOMY WITH INTRAOPERATIVE CHOLANGIOGRAM (N/A Abdomen)     Patient location during evaluation: PACU Anesthesia Type: General Level of consciousness: awake and alert Pain management: pain level controlled Vital Signs Assessment: post-procedure vital signs reviewed and stable Respiratory status: spontaneous breathing, nonlabored ventilation and respiratory function stable Cardiovascular status: blood pressure returned to baseline and stable Postop Assessment: no apparent nausea or vomiting Anesthetic complications: no   No complications documented.  Last Vitals:  Vitals:   04/26/20 1115 04/26/20 1130  BP: (!) 142/90 (!) 144/96  Pulse: 69 63  Resp: 17 17  Temp:  36.8 C  SpO2: 97% 99%    Last Pain:  Vitals:   04/26/20 1130  TempSrc:   PainSc: 3                  Elfrieda Espino,W. EDMOND

## 2022-02-07 IMAGING — US US ABDOMEN LIMITED
1 series · 14 of 25 positions shown · non-contrast
Comparison: None.

CLINICAL DATA: Nausea and vomiting

EXAM:
ULTRASOUND ABDOMEN LIMITED RIGHT UPPER QUADRANT

[Series 1: abdomen us · 14 of 54 slices shown]
[im 1/54]
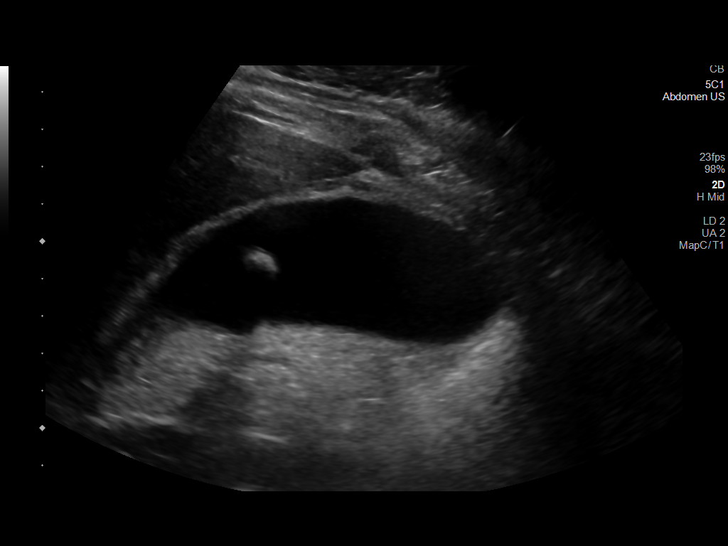
[im 5/54]
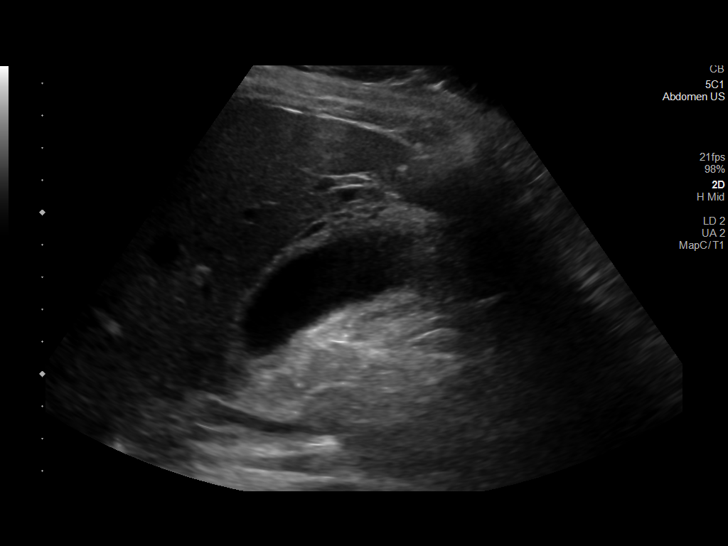
[im 9/54]
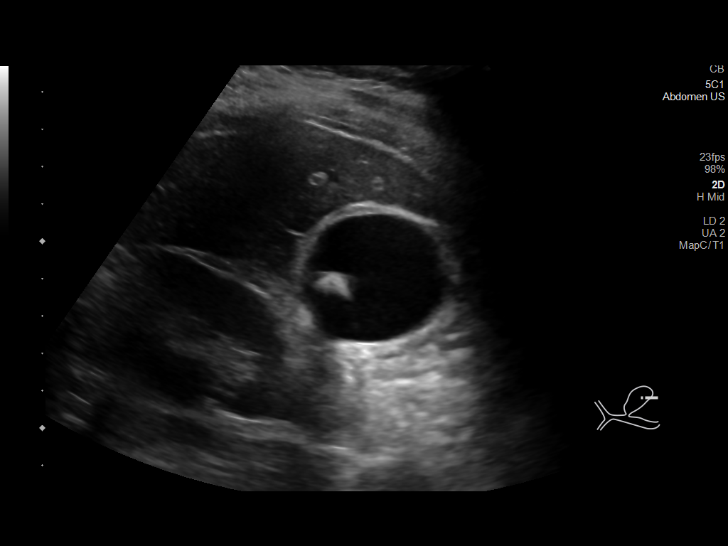
[im 14/54]
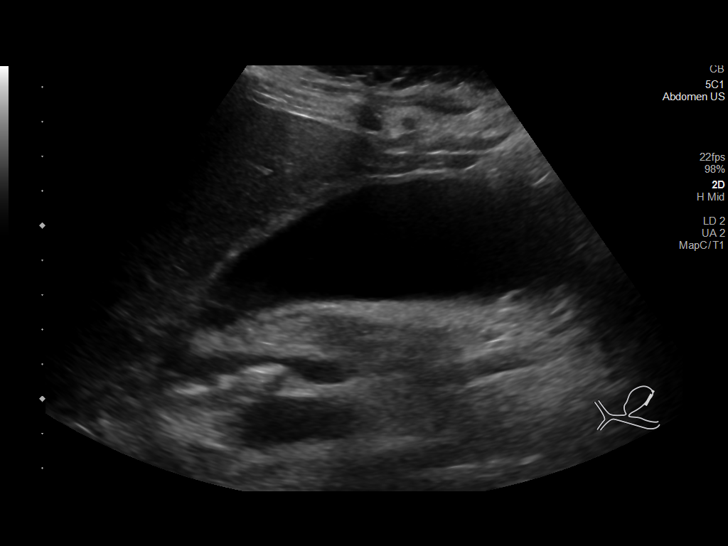
[im 18/54]
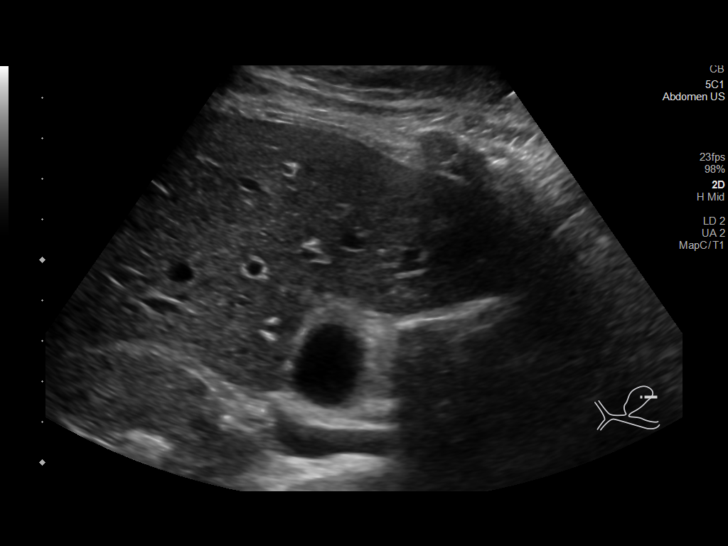
[im 20/54]
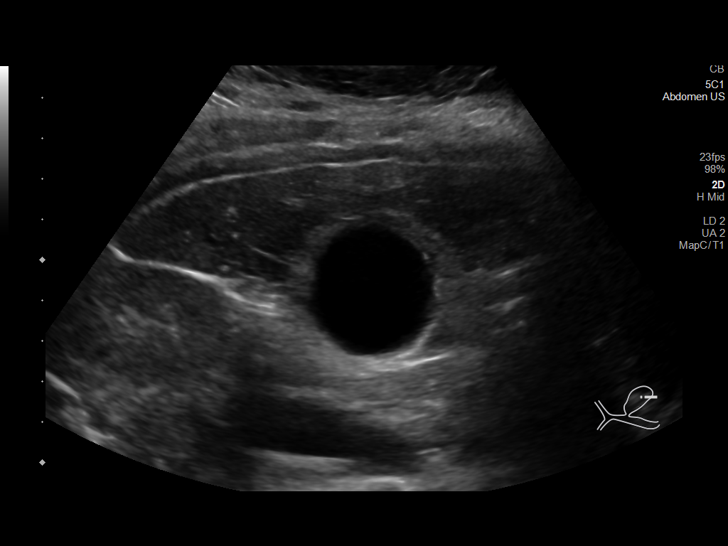
[im 25/54]
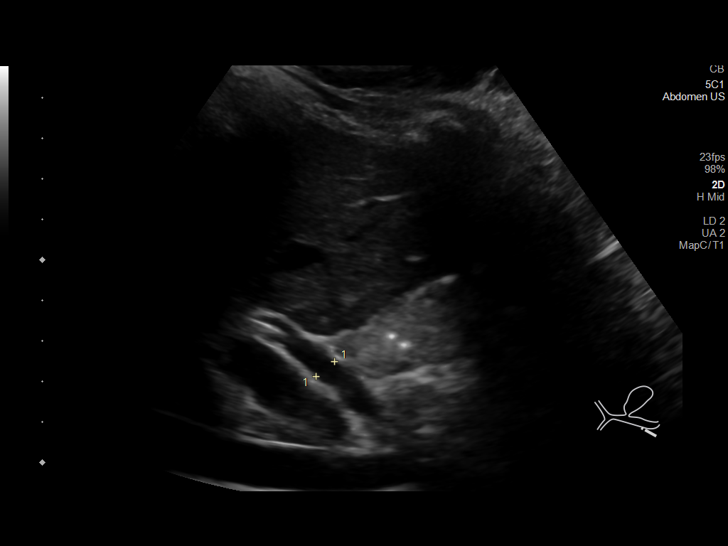
[im 29/54]
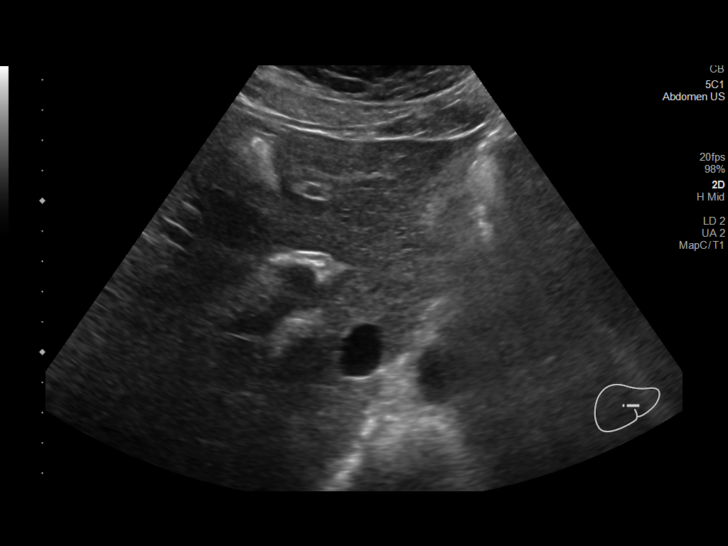
[im 34/54]
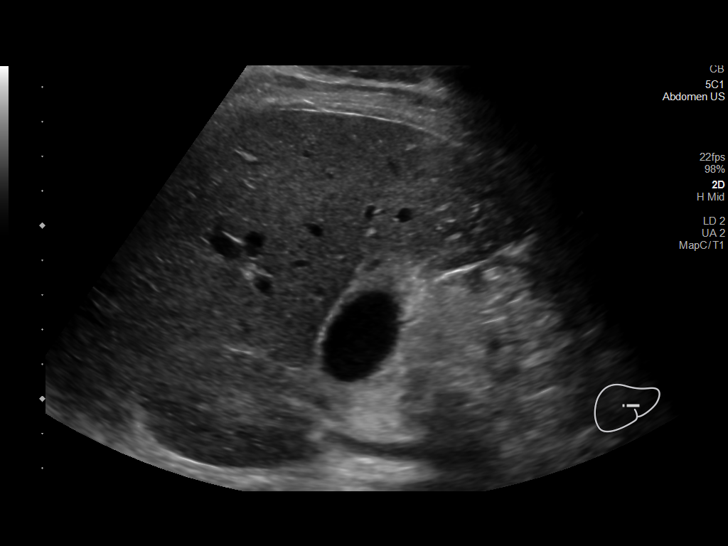
[im 36/54]
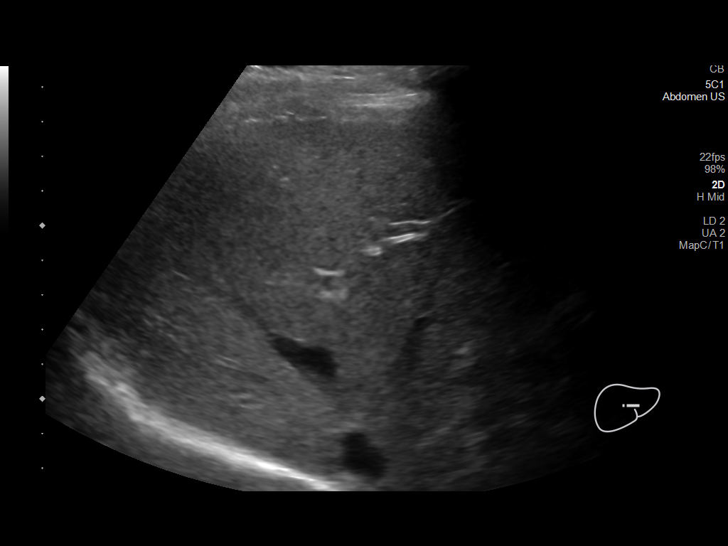
[im 40/54]
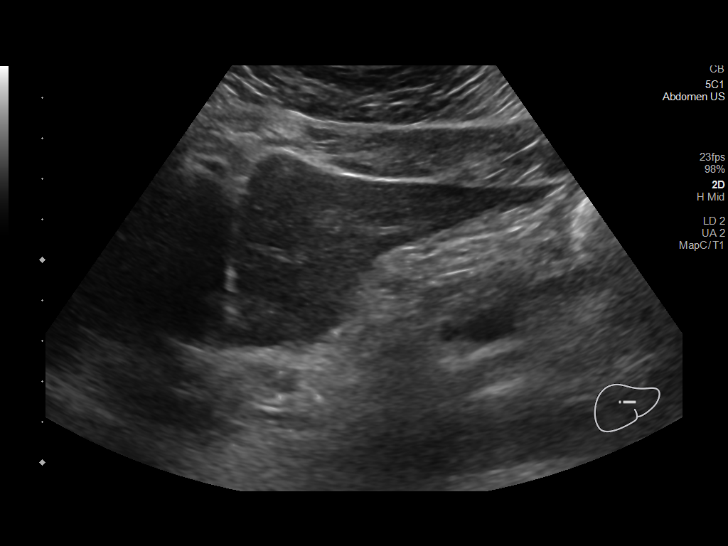
[im 45/54]
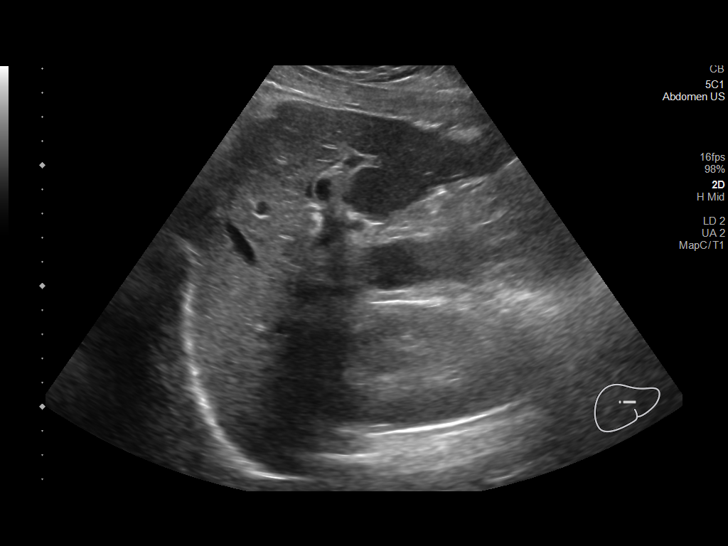
[im 49/54]
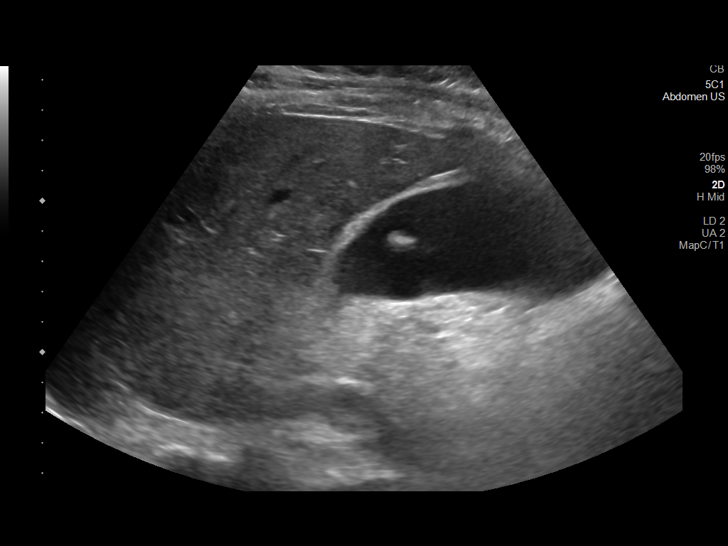
[im 54/54]
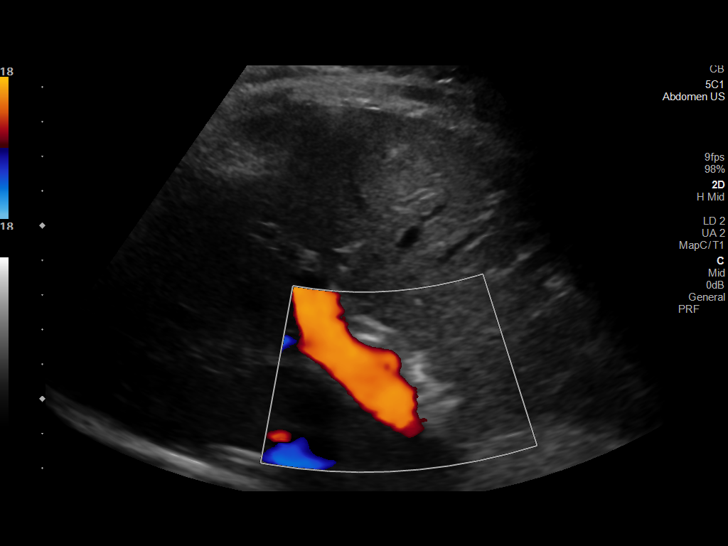

[14 of 25 positions shown; findings below may reference images not displayed]

FINDINGS: Gallbladder:

There are gallstones. There is gallbladder wall thickening with the
gallbladder wall measuring up to approximately 5 mm. The gallbladder
is distended. There is pericholecystic free fluid. The sonographic
Murphy sign is positive.

Common bile duct:

Diameter: 6 mm

Liver:

No focal lesion identified. Within normal limits in parenchymal
echogenicity. Portal vein is patent on color Doppler imaging with
normal direction of blood flow towards the liver.

Other: None.
IMPRESSION: Findings are consistent with acute calculus cholecystitis in the
appropriate clinical setting.

## 2022-04-26 ENCOUNTER — Other Ambulatory Visit: Payer: Self-pay | Admitting: Family Medicine

## 2022-04-26 ENCOUNTER — Ambulatory Visit
Admission: RE | Admit: 2022-04-26 | Discharge: 2022-04-26 | Disposition: A | Discharge status: Home / Self Care | Payer: BC Managed Care – PPO | Source: Ambulatory Visit | Attending: Family Medicine | Admitting: Family Medicine

## 2022-04-26 DIAGNOSIS — M5489 Other dorsalgia: Secondary | ICD-10-CM

## 2022-05-21 ENCOUNTER — Emergency Department (HOSPITAL_BASED_OUTPATIENT_CLINIC_OR_DEPARTMENT_OTHER)
Admission: EM | Admit: 2022-05-21 | Discharge: 2022-05-21 | Disposition: A | Discharge status: Home / Self Care | Payer: BC Managed Care – PPO | Attending: Emergency Medicine | Admitting: Emergency Medicine

## 2022-05-21 ENCOUNTER — Encounter (HOSPITAL_BASED_OUTPATIENT_CLINIC_OR_DEPARTMENT_OTHER): Payer: Self-pay | Admitting: Emergency Medicine

## 2022-05-21 ENCOUNTER — Other Ambulatory Visit: Payer: Self-pay

## 2022-05-21 ENCOUNTER — Emergency Department (HOSPITAL_BASED_OUTPATIENT_CLINIC_OR_DEPARTMENT_OTHER): Payer: BC Managed Care – PPO

## 2022-05-21 DIAGNOSIS — R059 Cough, unspecified: Secondary | ICD-10-CM | POA: Diagnosis present

## 2022-05-21 DIAGNOSIS — Z1152 Encounter for screening for COVID-19: Secondary | ICD-10-CM | POA: Insufficient documentation

## 2022-05-21 DIAGNOSIS — J101 Influenza due to other identified influenza virus with other respiratory manifestations: Secondary | ICD-10-CM | POA: Insufficient documentation

## 2022-05-21 DIAGNOSIS — J111 Influenza due to unidentified influenza virus with other respiratory manifestations: Secondary | ICD-10-CM

## 2022-05-21 LAB — RESP PANEL BY RT-PCR (RSV, FLU A&B, COVID)  RVPGX2
Influenza A by PCR: POSITIVE — AB
Influenza B by PCR: NEGATIVE
Resp Syncytial Virus by PCR: NEGATIVE
SARS Coronavirus 2 by RT PCR: NEGATIVE

## 2022-05-21 MED ORDER — OSELTAMIVIR PHOSPHATE 75 MG PO CAPS: 75.0000 mg | ORAL_CAPSULE | Freq: Two times a day (BID) | ORAL | 0 refills | Status: AC

## 2022-05-21 MED ORDER — BENZONATATE 100 MG PO CAPS: 100.0000 mg | ORAL_CAPSULE | Freq: Three times a day (TID) | ORAL | 0 refills | Status: AC

## 2022-05-21 MED ORDER — IBUPROFEN 800 MG PO TABS
800.0000 mg | ORAL_TABLET | Freq: Once | ORAL | Status: AC
  Administered 2022-05-21: 800 mg via ORAL
  Filled 2022-05-21: qty 1

## 2022-05-21 NOTE — ED Notes (Signed)
Sara Bell's O2 sat=96% on RA while ambulating in th hallway.

## 2022-05-21 NOTE — ED Notes (Addendum)
Fluid challenge passed... No N/V and able to keep the entire 8oz of water cup down.

## 2022-05-21 NOTE — ED Provider Notes (Signed)
[facility] EMERGENCY DEPT Provider Note   CSN: [id number] Arrival date & time: 05/21/22  1022     History  Chief Complaint  Patient presents with   Cough    Sara Bell is a 36 y.o. female.  Patient presents with a 4-day history of cough, sinus pressure, chills, sore throat, congestion.  No documented fever at home but has had chills.  Cough productive of clear and yellow mucus.  Feels sinus pressure and nasal congestion as well.  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  Taking NyQuil, TheraFlu and Mucinex at home without relief.  Comes in today because she feels like she is not getting better.  No chest pain or shortness of breath.  No abdominal pain, nausea or vomiting.  No pain with urination or blood in the urine. Denies any other medication use.  The history is provided by the patient.  Cough Associated symptoms: myalgias and rhinorrhea   Associated symptoms: no chest pain, no fever, no headaches and no rash        Home Medications Prior to Admission medications   Medication Sig Start Date End Date Taking? Authorizing Provider  cyclobenzaprine (FLEXERIL) 10 MG tablet Take 0.5-1 tablets (5-10 mg total) by mouth 2 (two) times daily as needed for muscle spasms. 05/31/18   Arthor Captain, PA-C  labetalol (NORMODYNE) 300 MG tablet Take 1 tablet (300 mg total) by mouth 3 (three) times daily. Patient not taking: Reported on 07/19/2016 01/31/15   Huel Cote, MD  meloxicam (MOBIC) 15 MG tablet Take 1 tablet (15 mg total) by mouth daily. Take 1 daily with food. 05/31/18   Harris, Cammy Copa, PA-C  potassium chloride SA (K-DUR,KLOR-CON) 20 MEQ tablet Take 2 tablets (40 mEq total) by mouth 3 (three) times daily. Patient not taking: Reported on 07/19/2016 01/31/15   Huel Cote, MD  Prenatal Vit-Fe Fumarate-FA (PRENATAL MULTIVITAMIN) TABS tablet Take 1 tablet by mouth daily at 12 noon.    [provider]      Allergies     Oxycodone-acetaminophen    Review of Systems   Review of Systems  Constitutional:  Positive for activity change and appetite change. Negative for fever.  HENT:  Positive for congestion and rhinorrhea.   Respiratory:  Positive for cough.   Cardiovascular:  Negative for chest pain.  Gastrointestinal:  Negative for abdominal pain, nausea and vomiting.  Genitourinary:  Negative for dysuria and hematuria.  Musculoskeletal:  Positive for arthralgias and myalgias.  Skin:  Negative for rash.  Neurological:  Positive for weakness. Negative for headaches.   all other systems are negative except as noted in the HPI and PMH.    Physical Exam Updated Vital Signs BP (!) 149/97 (BP Location: Right Arm)   Pulse 73   Temp 98.3 F (36.8 C) (Oral)   Resp 18   SpO2 98%  Physical Exam Vitals and nursing note reviewed.  Constitutional:      General: She is not in acute distress.    Appearance: She is well-developed. She is not ill-appearing.  HENT:     Head: Normocephalic and atraumatic.     Right Ear: Tympanic membrane normal.     Left Ear: Tympanic membrane normal.     Mouth/Throat:     Pharynx: No oropharyngeal exudate.  Eyes:     Conjunctiva/sclera: Conjunctivae normal.     Pupils: Pupils are equal, round, and reactive to light.  Neck:     Comments: No meningismus. Cardiovascular:     Rate and  Rhythm: Normal rate and regular rhythm.     Heart sounds: Normal heart sounds. No murmur heard. Pulmonary:     Effort: Pulmonary effort is normal. No respiratory distress.     Breath sounds: Normal breath sounds.  Chest:     Chest wall: No tenderness.  Abdominal:     Palpations: Abdomen is soft.     Tenderness: There is no abdominal tenderness. There is no guarding or rebound.  Musculoskeletal:        General: No tenderness. Normal range of motion.     Cervical back: Normal range of motion and neck supple.  Skin:    General: Skin is warm.     Findings: No rash.  Neurological:      General: No focal deficit present.     Mental Status: She is alert and oriented to person, place, and time. Mental status is at baseline.     Cranial Nerves: No cranial nerve deficit.     Motor: No abnormal muscle tone.     Coordination: Coordination normal.     Comments:  5/5 strength throughout. CN 2-12 intact.Equal grip strength.   Psychiatric:        Behavior: Behavior normal.     ED Results / Procedures / Treatments   Labs (all labs ordered are listed, but only abnormal results are displayed) Labs Reviewed  RESP PANEL BY RT-PCR (RSV, FLU A&B, COVID)  RVPGX2 - Abnormal; Notable for the following components:      Result Value   Influenza A by PCR POSITIVE (*)    All other components within normal limits    EKG None  Radiology DG Chest Portable 1 View  Result Date: 05/21/2022 CLINICAL DATA:  Cough, fever and chills. EXAM: PORTABLE CHEST 1 VIEW COMPARISON:  07/19/2016. FINDINGS: Cardiac silhouette normal in size and configuration. Normal mediastinal and hilar contours. Clear lungs.  No pleural effusion or pneumothorax. Stable dextroscoliosis of the thoracic spine. IMPRESSION: No active disease. Electronically Signed   By: Amie Portland M.D.   On: 05/21/2022 12:16    Procedures Procedures    Medications Ordered in ED Medications  ibuprofen (ADVIL) tablet 800 mg (has no administration in time range)    ED Course/ Medical Decision Making/ A&P                           Medical Decision Making Amount and/or Complexity of Data Reviewed Labs: ordered. Decision-making details documented in ED Course. Radiology: ordered and independent interpretation performed. Decision-making details documented in ED Course. ECG/medicine tests: ordered and independent interpretation performed. Decision-making details documented in ED Course.  Risk Prescription drug management.  4 days of URI symptoms with cough, congestion, body aches and chills.  No hypoxia or increased work of  breathing.  Chest x-ray is negative.  No infiltrate.  Results reviewed interpreted by me.  Flu swab is positive and likely the source of her symptoms.  Vitals remained stable.  No hypoxia or increased work of breathing.  She is tolerating p.o.  No nausea or vomiting.  Discussed with care at home including antipyretics, p.o. hydration and Tamiflu after discussion of risk and benefits.  Return to the ED sooner with chest pain, exertional shortness of breath, intractable nausea and vomiting or any other concerns.        Final Clinical Impression(s) / ED Diagnoses Final diagnoses:  Influenza    Rx / DC Orders ED Discharge Orders     None  Glynn Octave, MD 05/21/22 1414

## 2022-05-21 NOTE — ED Triage Notes (Signed)
Pt presents to ED POV. Pt c/o cough, rhinorrhea, sinus pressure, cold chills, since Thursday. Cough is productive and becoming clear. Rep e/u in triage. Nyquil, theraflu, and mucinex at home w/o relief

## 2022-05-21 NOTE — ED Notes (Signed)
Discharge paperwork given and verbally understood. 

## 2022-05-21 NOTE — Discharge Instructions (Signed)
Keep yourself hydrated.  Use Tylenol or Motrin as needed for aches and fever.  Follow-up with your primary doctor.  Return to the ED with difficulty breathing, chest pain, not able to eat or drink or any other concerns

## 2023-04-12 ENCOUNTER — Encounter (HOSPITAL_COMMUNITY): Payer: Self-pay

## 2023-04-12 ENCOUNTER — Emergency Department (HOSPITAL_COMMUNITY)
Admission: EM | Admit: 2023-04-12 | Discharge: 2023-04-12 | Disposition: A | Discharge status: Home / Self Care | Payer: BC Managed Care – PPO | Attending: Emergency Medicine | Admitting: Emergency Medicine

## 2023-04-12 ENCOUNTER — Other Ambulatory Visit: Payer: Self-pay

## 2023-04-12 ENCOUNTER — Emergency Department (HOSPITAL_COMMUNITY): Payer: BC Managed Care – PPO

## 2023-04-12 DIAGNOSIS — R42 Dizziness and giddiness: Secondary | ICD-10-CM | POA: Insufficient documentation

## 2023-04-12 DIAGNOSIS — J029 Acute pharyngitis, unspecified: Secondary | ICD-10-CM | POA: Diagnosis not present

## 2023-04-12 DIAGNOSIS — Z20822 Contact with and (suspected) exposure to covid-19: Secondary | ICD-10-CM | POA: Diagnosis not present

## 2023-04-12 DIAGNOSIS — M791 Myalgia, unspecified site: Secondary | ICD-10-CM | POA: Diagnosis not present

## 2023-04-12 DIAGNOSIS — R079 Chest pain, unspecified: Secondary | ICD-10-CM | POA: Insufficient documentation

## 2023-04-12 DIAGNOSIS — R6889 Other general symptoms and signs: Secondary | ICD-10-CM

## 2023-04-12 DIAGNOSIS — R059 Cough, unspecified: Secondary | ICD-10-CM | POA: Diagnosis not present

## 2023-04-12 LAB — BASIC METABOLIC PANEL
Anion gap: 7 (ref 5–15)
BUN: 8 mg/dL (ref 6–20)
CO2: 24 mmol/L (ref 22–32)
Calcium: 9.3 mg/dL (ref 8.9–10.3)
Chloride: 106 mmol/L (ref 98–111)
Creatinine, Ser: 0.83 mg/dL (ref 0.44–1.00)
GFR, Estimated: >60 mL/min (ref >60)
Glucose, Bld: 89 mg/dL (ref 70–99)
Potassium: 4.2 mmol/L (ref 3.5–5.1)
Sodium: 137 mmol/L (ref 135–145)

## 2023-04-12 LAB — CBC
HCT: 44.4 % (ref 36.0–46.0)
Hemoglobin: 14.7 g/dL (ref 12.0–15.0)
MCH: 30.8 pg (ref 26.0–34.0)
MCHC: 33.1 g/dL (ref 30.0–36.0)
MCV: 92.9 fL (ref 80.0–100.0)
Platelets: 259 10*3/uL (ref 150–400)
RBC: 4.78 MIL/uL (ref 3.87–5.11)
RDW: 12.2 % (ref 11.5–15.5)
WBC: 7.9 10*3/uL (ref 4.0–10.5)
nRBC: 0 % (ref 0.0–0.2)

## 2023-04-12 LAB — HCG, SERUM, QUALITATIVE: Preg, Serum: NEGATIVE

## 2023-04-12 LAB — RESP PANEL BY RT-PCR (RSV, FLU A&B, COVID)  RVPGX2
Influenza A by PCR: NEGATIVE
Influenza B by PCR: NEGATIVE
Resp Syncytial Virus by PCR: NEGATIVE
SARS Coronavirus 2 by RT PCR: NEGATIVE

## 2023-04-12 LAB — TROPONIN I (HIGH SENSITIVITY)
Troponin I (High Sensitivity): 4 ng/L (ref <18)
Troponin I (High Sensitivity): 6 ng/L (ref <18)

## 2023-04-12 NOTE — Discharge Instructions (Signed)
Contact a health care provider if: You are getting worse instead of better. You have a fever or chills. Your mucus is brown or red. You have yellow or brown discharge coming from your nose. You have pain in your face, especially when you bend forward. You have swollen neck glands. You have pain while swallowing. You have white areas in the back of your throat. Get help right away if: You have shortness of breath that gets worse. You have severe or persistent: Headache. Ear pain. Sinus pain. Chest pain. You have chronic lung disease along with any of the following: Making high-pitched whistling sounds when you breathe, most often when you breathe out (wheezing). Prolonged cough (more than 14 days). Coughing up blood. A change in your usual mucus. You have a stiff neck. You have changes in your: Vision. Hearing. Thinking. Mood. These symptoms may be an emergency. Get help right away. Call 911. Do not wait to see if the symptoms will go away. Do not drive yourself to the hospital.

## 2023-04-12 NOTE — ED Triage Notes (Addendum)
Pt c/o left sided chest pain, dizziness, tingling inside of entire body and "body feels hot on the inside" started yesterday. Pt states had a sore throat on Tuesday

## 2023-04-12 NOTE — ED Provider Notes (Signed)
[place] EMERGENCY DEPARTMENT AT [facility] Provider Note   CSN: [id number] Arrival date & time: 04/12/23  1159     History  Chief Complaint  Patient presents with   Chest Pain   Dizziness    Sara Bell is a 37 y.o. female who presents emergency department with a chief complaint of chest pain.  The patient developed body aches chills painful cough and sore throat starting 2 days ago.  She has felt a little bit more short of breath and lightheaded with standing but denies a history of asthma, hemoptysis, fever, unilateral leg swelling.  States that she thinks she might have the flu but was tested for COVID earlier today was negative .  Patient states she just wanted make sure she did not have the flu or pneumonia.  Chest Pain Associated symptoms: dizziness   Dizziness Associated symptoms: chest pain        Home Medications Prior to Admission medications   Medication Sig Start Date End Date Taking? Authorizing Provider  benzonatate (TESSALON) 100 MG capsule Take 1 capsule (100 mg total) by mouth every 8 (eight) hours. 05/21/22   Rancour, Jeannett Senior, MD  cyclobenzaprine (FLEXERIL) 10 MG tablet Take 0.5-1 tablets (5-10 mg total) by mouth 2 (two) times daily as needed for muscle spasms. 05/31/18   Arthor Captain, PA-C  labetalol (NORMODYNE) 300 MG tablet Take 1 tablet (300 mg total) by mouth 3 (three) times daily. Patient not taking: Reported on 07/19/2016 01/31/15   Huel Cote, MD  meloxicam (MOBIC) 15 MG tablet Take 1 tablet (15 mg total) by mouth daily. Take 1 daily with food. 05/31/18   Arthor Captain, PA-C  oseltamivir (TAMIFLU) 75 MG capsule Take 1 capsule (75 mg total) by mouth every 12 (twelve) hours. 05/21/22   Rancour, Jeannett Senior, MD  potassium chloride SA (K-DUR,KLOR-CON) 20 MEQ tablet Take 2 tablets (40 mEq total) by mouth 3 (three) times daily. Patient not taking: Reported on 07/19/2016 01/31/15   Huel Cote, MD  Prenatal Vit-Fe Fumarate-FA  (PRENATAL MULTIVITAMIN) TABS tablet Take 1 tablet by mouth daily at 12 noon.    [provider]      Allergies    Oxycodone-acetaminophen    Review of Systems   Review of Systems  Cardiovascular:  Positive for chest pain.  Neurological:  Positive for dizziness.    Physical Exam Updated Vital Signs BP (!) 137/94 (BP Location: Left Arm)   Pulse 66   Temp 98.4 F (36.9 C)   Resp 20   LMP 03/22/2023 (Approximate)   SpO2 100%  Physical Exam Vitals and nursing note reviewed.  Constitutional:      General: She is not in acute distress.    Appearance: She is well-developed. She is not diaphoretic.  HENT:     Head: Normocephalic and atraumatic.     Right Ear: External ear normal.     Left Ear: External ear normal.     Nose: Nose normal.     Mouth/Throat:     Mouth: Mucous membranes are moist.  Eyes:     General: No scleral icterus.    Conjunctiva/sclera: Conjunctivae normal.  Cardiovascular:     Rate and Rhythm: Normal rate and regular rhythm.     Heart sounds: Normal heart sounds. No murmur heard.    No friction rub. No gallop.  Pulmonary:     Effort: Pulmonary effort is normal. No respiratory distress.     Breath sounds: Normal breath sounds.  Abdominal:  General: Bowel sounds are normal. There is no distension.     Palpations: Abdomen is soft. There is no mass.     Tenderness: There is no abdominal tenderness. There is no guarding.  Musculoskeletal:     Cervical back: Normal range of motion.     Right lower leg: No edema.     Left lower leg: No edema.  Skin:    General: Skin is warm and dry.  Neurological:     Mental Status: She is alert and oriented to person, place, and time.  Psychiatric:        Behavior: Behavior normal.     ED Results / Procedures / Treatments   Labs (all labs ordered are listed, but only abnormal results are displayed) Labs Reviewed  RESP PANEL BY RT-PCR (RSV, FLU A&B, COVID)  [id number]  BASIC METABOLIC PANEL  CBC  HCG,  SERUM, QUALITATIVE  TROPONIN I (HIGH SENSITIVITY)  TROPONIN I (HIGH SENSITIVITY)    EKG None  Radiology DG Chest 2 View  Result Date: 04/12/2023 CLINICAL DATA:  Chest pain EXAM: CHEST - 2 VIEW COMPARISON:  05/21/2022 FINDINGS: Cardiac and mediastinal contours are within normal limits. No focal pulmonary opacity. No pleural effusion or pneumothorax. No acute osseous abnormality. Unchanged dextroscoliosis of the thoracic spine. IMPRESSION: No acute cardiopulmonary process. Electronically Signed   By: Wiliam Ke M.D.   On: 04/12/2023 15:03    Procedures Procedures    Medications Ordered in ED Medications - No data to display  ED Course/ Medical Decision Making/ A&P Clinical Course as of 04/12/23 1625  Thu Apr 12, 2023  1602 DG Chest 2 View [AH]  [phone] ED EKG [AH]  1602 Troponin I (High Sensitivity) [AH]  1602 Basic metabolic panel [AH]  1602 hCG, serum, qualitative [AH]  1602 CBC [AH]  1602 Troponin I (High Sensitivity) Labs WNL [AH]  1602 I personally visualized and interpreted the images using our PACS system. Acute findings include:  No acute findings on CXR  [AH]  1603 ED EKG ECG interpretation  Rate: 80  Rhythm: normal sinus rhythm  QRS Axis: normal  Intervals: normal  ST/T Wave abnormalities: normal  Conduction Disutrbances: none  Narrative Interpretation:      [AH]    Clinical Course User Index [AH] Arthor Captain, PA-C                                 Medical Decision Making Given the large differential diagnosis for Sara Bell, the decision making in this case is of high complexity.  After evaluating all of the data points in this case, the presentation of Sara Bell is NOT consistent with Acute Coronary Syndrome (ACS) and/or myocardial ischemia, pulmonary embolism, aortic dissection; Sara Bell, significant arrythmia, pneumothorax, cardiac tamponade, or other emergent cardiopulmonary condition.  Further, the presentation  of Sara Bell is NOT consistent with pericarditis, myocarditis, cholecystitis, pancreatitis, mediastinitis, endocarditis, new valvular disease.  Additionally, the presentation of Sara Bell NOT consistent with flail chest, cardiac contusion, ARDS, or significant intra-thoracic or intra-abdominal bleeding.  Moreover, this presentation is NOT consistent with pneumonia, sepsis, or pyelonephritis.  In shared decision making I have ordered a respiratory panel swab.  Patient may follow-up on this in the outpatient setting.  She is comfortable with discharge and work note.  She has no evidence of pneumonia.  She is comfortable taking over-the-counter cold medications.  She does not need to wait in  the emergency department for a second troponin because her symptoms are not concerning for ACS .  I followed up on workup initiated at triage.  Patient appropriate for discharge   Strict return and follow-up precautions have been given by me personally or by detailed written instruction given verbally by nursing staff using the teach back method to the patient/family/caregiver(s).  Data Reviewed/Counseling: I have reviewed the patient's vital signs, nursing notes, and other relevant tests/information. I had a detailed discussion regarding the historical points, exam findings, and any diagnostic results supporting the discharge diagnosis. I also discussed the need for outpatient follow-up and the need to return to the ED if symptoms worsen or if there are any questions or concerns that arise at home.    Amount and/or Complexity of Data Reviewed Labs:  Decision-making details documented in ED Course. Radiology: independent interpretation performed. ECG/medicine tests: independent interpretation performed. Decision-making details documented in ED Course.           Final Clinical Impression(s) / ED Diagnoses Final diagnoses:  Flu-like symptoms    Rx / DC Orders ED  Discharge Orders     None         Arthor Captain, PA-C 04/12/23 1625    Derwood Kaplan, MD 04/13/23 1054
